# Patient Record
Sex: Male | Born: 1975
Health system: Southern US, Community
[De-identification: ages and names within clinical notes are randomized; demographics above are authoritative.]

## PROBLEM LIST (undated history)

## (undated) DIAGNOSIS — F32A Depression, unspecified: Secondary | ICD-10-CM

## (undated) DIAGNOSIS — I499 Cardiac arrhythmia, unspecified: Secondary | ICD-10-CM

## (undated) DIAGNOSIS — R7303 Prediabetes: Secondary | ICD-10-CM

## (undated) DIAGNOSIS — F329 Major depressive disorder, single episode, unspecified: Secondary | ICD-10-CM

## (undated) DIAGNOSIS — E119 Type 2 diabetes mellitus without complications: Secondary | ICD-10-CM

## (undated) DIAGNOSIS — K219 Gastro-esophageal reflux disease without esophagitis: Secondary | ICD-10-CM

## (undated) DIAGNOSIS — K449 Diaphragmatic hernia without obstruction or gangrene: Secondary | ICD-10-CM

## (undated) DIAGNOSIS — I209 Angina pectoris, unspecified: Secondary | ICD-10-CM

## (undated) HISTORY — DX: Depression, unspecified: F32.A

## (undated) HISTORY — DX: Gastro-esophageal reflux disease without esophagitis: K21.9

## (undated) HISTORY — DX: Type 2 diabetes mellitus without complications: E11.9

## (undated) HISTORY — DX: Diaphragmatic hernia without obstruction or gangrene: K44.9

## (undated) HISTORY — PX: UPPER GASTROINTESTINAL ENDOSCOPY: SHX188

## (undated) HISTORY — PX: FOOT SURGERY: SHX648

## (undated) HISTORY — PX: TOOTH EXTRACTION: SUR596

---
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
## 1999-05-26 ENCOUNTER — Emergency Department (HOSPITAL_COMMUNITY): Admission: EM | Admit: 1999-05-26 | Discharge: 1999-05-26 | Payer: Self-pay | Admitting: Emergency Medicine

## 2009-04-02 ENCOUNTER — Emergency Department (HOSPITAL_COMMUNITY): Admission: EM | Admit: 2009-04-02 | Discharge: 2009-04-03 | Payer: Self-pay | Admitting: Emergency Medicine

## 2011-03-18 LAB — CK: Total CK: 98 U/L (ref 7–232)

## 2011-03-18 LAB — COMPREHENSIVE METABOLIC PANEL
ALT: 22 U/L (ref 0–53)
AST: 23 U/L (ref 0–37)
Albumin: 3.9 g/dL (ref 3.5–5.2)
Alkaline Phosphatase: 51 U/L (ref 39–117)
BUN: 8 mg/dL (ref 6–23)
CO2: 25 mEq/L (ref 19–32)
Calcium: 9.2 mg/dL (ref 8.4–10.5)
Chloride: 104 mEq/L (ref 96–112)
Creatinine, Ser: 1.07 mg/dL (ref 0.4–1.5)
GFR calc Af Amer: >60 mL/min (ref >60)
GFR calc non Af Amer: >60 mL/min (ref >60)
Glucose, Bld: 96 mg/dL (ref 70–99)
Potassium: 3.8 mEq/L (ref 3.5–5.1)
Sodium: 134 mEq/L — ABNORMAL LOW (ref 135–145)
Total Bilirubin: 2.5 mg/dL — ABNORMAL HIGH (ref 0.3–1.2)
Total Protein: 7.4 g/dL (ref 6.0–8.3)

## 2011-03-18 LAB — URINALYSIS, ROUTINE W REFLEX MICROSCOPIC
Bilirubin Urine: NEGATIVE
Glucose, UA: NEGATIVE mg/dL
Hgb urine dipstick: NEGATIVE
Ketones, ur: NEGATIVE mg/dL
Nitrite: NEGATIVE
Protein, ur: NEGATIVE mg/dL
Specific Gravity, Urine: 1.014 (ref 1.005–1.030)
Urobilinogen, UA: 1 mg/dL (ref 0.0–1.0)
pH: 7.5 (ref 5.0–8.0)

## 2011-03-18 LAB — LIPASE, BLOOD: Lipase: 18 U/L (ref 11–59)

## 2011-03-18 LAB — DIFFERENTIAL
Basophils Absolute: 0.1 10*3/uL (ref 0.0–0.1)
Basophils Relative: 1 % (ref 0–1)
Eosinophils Absolute: 0 10*3/uL (ref 0.0–0.7)
Eosinophils Relative: 0 % (ref 0–5)
Lymphocytes Relative: 8 % — ABNORMAL LOW (ref 12–46)
Lymphs Abs: 1.2 10*3/uL (ref 0.7–4.0)
Monocytes Absolute: 1.6 10*3/uL — ABNORMAL HIGH (ref 0.1–1.0)
Monocytes Relative: 11 % (ref 3–12)
Neutro Abs: 12.1 10*3/uL — ABNORMAL HIGH (ref 1.7–7.7)
Neutrophils Relative %: 80 % — ABNORMAL HIGH (ref 43–77)

## 2011-03-18 LAB — CBC
HCT: 44 % (ref 39.0–52.0)
Hemoglobin: 14.9 g/dL (ref 13.0–17.0)
MCHC: 33.9 g/dL (ref 30.0–36.0)
MCV: 89.1 fL (ref 78.0–100.0)
Platelets: 190 10*3/uL (ref 150–400)
RBC: 4.93 MIL/uL (ref 4.22–5.81)
RDW: 13.1 % (ref 11.5–15.5)
WBC: 15.1 10*3/uL — ABNORMAL HIGH (ref 4.0–10.5)

## 2011-03-18 LAB — CULTURE, BLOOD (ROUTINE X 2)
Culture: NO GROWTH
Culture: NO GROWTH

## 2011-03-18 LAB — GC/CHLAMYDIA PROBE AMP, GENITAL
Chlamydia, DNA Probe: NEGATIVE
GC Probe Amp, Genital: NEGATIVE

## 2020-01-30 ENCOUNTER — Telehealth: Payer: Self-pay | Admitting: *Deleted

## 2020-01-30 NOTE — Telephone Encounter (Signed)
Tried to contact pt to go over screening questions prior to visit and phone rang and said call could not be completed at this time.Chad Morris, CMA

## 2020-01-30 NOTE — Progress Notes (Deleted)
    SUBJECTIVE:   CHIEF COMPLAINT / HPI:   ***  PERTINENT  PMH / PSH: ***  OBJECTIVE:   There were no vitals taken for this visit.  ***  ASSESSMENT/PLAN:   No problem-specific Assessment & Plan notes found for this encounter.     Shir Bergman, MD [facility]   

## 2020-01-31 ENCOUNTER — Ambulatory Visit: Payer: Self-pay | Admitting: Family Medicine

## 2020-01-31 ENCOUNTER — Ambulatory Visit: Payer: BC Managed Care – PPO | Admitting: Critical Care Medicine

## 2020-01-31 ENCOUNTER — Other Ambulatory Visit: Payer: Self-pay

## 2020-01-31 VITALS — BP 131/73 | HR 66 | Temp 98.0°F | Resp 18 | Ht 73.0 in

## 2020-01-31 DIAGNOSIS — Z114 Encounter for screening for human immunodeficiency virus [HIV]: Secondary | ICD-10-CM | POA: Diagnosis not present

## 2020-01-31 DIAGNOSIS — Z8639 Personal history of other endocrine, nutritional and metabolic disease: Secondary | ICD-10-CM

## 2020-01-31 DIAGNOSIS — R7303 Prediabetes: Secondary | ICD-10-CM | POA: Insufficient documentation

## 2020-01-31 DIAGNOSIS — Z23 Encounter for immunization: Secondary | ICD-10-CM | POA: Diagnosis not present

## 2020-01-31 LAB — POCT GLYCOSYLATED HEMOGLOBIN (HGB A1C): Hemoglobin A1C: 5.2 % (ref 4.0–5.6)

## 2020-01-31 LAB — GLUCOSE, POCT (MANUAL RESULT ENTRY): POC Glucose: 86 mg/dl (ref 70–99)

## 2020-01-31 NOTE — Assessment & Plan Note (Signed)
History of prediabetes however today the A1c is normal and glucose is 86  The patient currently is diet controlled and does not require further therapy  We will monitor this for now

## 2020-01-31 NOTE — Patient Instructions (Signed)
No medication changes at this visit  Your hemoglobin A1c is normal so you do not have active diabetes at this time just continue your exercise program healthy diet  Labs today include metabolic panel HIV and complete blood count  Return to see Dr. Delford Field in the community care clinic to establish in the next month

## 2020-01-31 NOTE — Progress Notes (Signed)
Subjective:    Patient ID: Chad Morris, male    DOB: 1976/09/15, 44 y.o.   MRN: [id number]  44 y.o. male who just got out of criminal justice system 2 months ago after being incarcerated for 9 years.  Patient is Currently under probationary arrangement locally.  He does have housing.  Patient was diagnosed with type 2 diabetes while in the prison and is coming to the mobile clinic today for further screening.  Note the patient does not have a primary care provider at this time.  Patient does not drink alcohol or use other recreational substances other than marijuana.  Patient has no specific complaints at this time.  He is not on any current medications.  Patient denies polyphagia polydipsia polyuria.  He is not monitoring his blood sugar at home.  Past Medical History:  Diagnosis Date  . Diabetes mellitus without complication (HCC)      Family History  Problem Relation Age of Onset  . Cancer Mother      Social History   Socioeconomic History  . Marital status: Single    Spouse name: Not on file  . Number of children: Not on file  . Years of education: Not on file  . Highest education level: Not on file  Occupational History  . Not on file  Tobacco Use  . Smoking status: Never Smoker  . Smokeless tobacco: Never Used  Substance and Sexual Activity  . Alcohol use: Not on file  . Drug use: Not on file  . Sexual activity: Not on file  Other Topics Concern  . Not on file  Social History Narrative  . Not on file   Social Determinants of Health   Financial Resource Strain:   . Difficulty of Paying Living Expenses: Not on file  Food Insecurity:   . Worried About Programme researcher, broadcasting/film/video in the Last Year: Not on file  . Ran Out of Food in the Last Year: Not on file  Transportation Needs:   . Lack of Transportation (Medical): Not on file  . Lack of Transportation (Non-Medical): Not on file  Physical Activity:   . Days of Exercise per Week: Not on file  . Minutes of  Exercise per Session: Not on file  Stress:   . Feeling of Stress : Not on file  Social Connections:   . Frequency of Communication with Friends and Family: Not on file  . Frequency of Social Gatherings with Friends and Family: Not on file  . Attends Religious Services: Not on file  . Active Member of Clubs or Organizations: Not on file  . Attends Banker Meetings: Not on file  . Marital Status: Not on file  Intimate Partner Violence:   . Fear of Current or Ex-Partner: Not on file  . Emotionally Abused: Not on file  . Physically Abused: Not on file  . Sexually Abused: Not on file     Allergies  Allergen Reactions  . Penicillins     Diagnosed as a child, does not recall Sx     No outpatient medications prior to visit.   No facility-administered medications prior to visit.    Review of Systems  HENT: Positive for postnasal drip and rhinorrhea. Negative for sore throat and trouble swallowing.   Eyes: Negative.   Respiratory: Positive for cough. Negative for shortness of breath and wheezing.   Cardiovascular: Positive for palpitations. Negative for chest pain and leg swelling.       Occ palpitations  Gastrointestinal: Negative for rectal pain.       GERD  Genitourinary: Negative.   Musculoskeletal: Negative.   Neurological: Negative for seizures and syncope.  Psychiatric/Behavioral: Negative.  Negative for self-injury, sleep disturbance and suicidal ideas.       Objective:   Physical Exam Vitals:   01/31/20 1053  BP: 131/73  Pulse: 66  Resp: 18  Temp: 98 F (36.7 C)  TempSrc: Oral  SpO2: 96%  Height: 6\' 1"  (1.854 m)    Gen: Pleasant, well-nourished, in no distress,  normal affect well-developed and muscular  ENT: No lesions,  mouth clear,  oropharynx clear, no postnasal drip  Neck: No JVD, no TMG, no carotid bruits  Lungs: No use of accessory muscles, no dullness to percussion, clear without rales or rhonchi  Cardiovascular: RRR, heart sounds  normal, no murmur or gallops, no peripheral edema  Abdomen: soft and NT, no HSM,  BS normal  Musculoskeletal: No deformities, no cyanosis or clubbing  Neuro: alert, non focal  Skin: Warm, no lesions or rashes   No recent labs in the system  Note hemoglobin A1c was 5.2 today Lab Results  Component Value Date   HGBA1C 5.2 01/31/2020    Capillary blood glucose today 86     Assessment & Plan:  I personally reviewed all images and lab data in the [facility] system as well as any outside material available during this office visit and agree with the  radiology impressions.   Prediabetes History of prediabetes however today the A1c is normal and glucose is 86  The patient currently is diet controlled and does not require further therapy  We will monitor this for now   Chad Morris was seen today for diabetes.  Diagnoses and all orders for this visit:  History of elevated glucose -     HgB A1c -     Glucose (CBG) -     Comprehensive metabolic panel -     CBC with Differential/Platelet; Future -     CBC with Differential/Platelet  Encounter for screening for HIV -     HIV antibody (with reflex)  Prediabetes  Other orders -     Tdap vaccine greater than or equal to 7yo IM   Will obtain a general lab screen today and an HIV panel and have the patient come to the community care clinic to establish for primary care in the next month

## 2020-02-01 LAB — CBC WITH DIFFERENTIAL/PLATELET
Basophils Absolute: 0.1 10*3/uL (ref 0.0–0.2)
Basos: 1 %
EOS (ABSOLUTE): 0.1 10*3/uL (ref 0.0–0.4)
Eos: 3 %
Hematocrit: 44.3 % (ref 37.5–51.0)
Hemoglobin: 14.9 g/dL (ref 13.0–17.7)
Immature Grans (Abs): 0 10*3/uL (ref 0.0–0.1)
Immature Granulocytes: 0 %
Lymphocytes Absolute: 2.1 10*3/uL (ref 0.7–3.1)
Lymphs: 44 %
MCH: 29.4 pg (ref 26.6–33.0)
MCHC: 33.6 g/dL (ref 31.5–35.7)
MCV: 87 fL (ref 79–97)
Monocytes Absolute: 0.5 10*3/uL (ref 0.1–0.9)
Monocytes: 10 %
Neutrophils Absolute: 2 10*3/uL (ref 1.4–7.0)
Neutrophils: 42 %
Platelets: 213 10*3/uL (ref 150–450)
RBC: 5.07 x10E6/uL (ref 4.14–5.80)
RDW: 13.3 % (ref 11.6–15.4)
WBC: 4.7 10*3/uL (ref 3.4–10.8)

## 2020-02-01 LAB — COMPREHENSIVE METABOLIC PANEL
ALT: 23 IU/L (ref 0–44)
AST: 33 IU/L (ref 0–40)
Albumin/Globulin Ratio: 1.2 (ref 1.2–2.2)
Albumin: 4.1 g/dL (ref 4.0–5.0)
Alkaline Phosphatase: 88 IU/L (ref 39–117)
BUN/Creatinine Ratio: 9 (ref 9–20)
BUN: 10 mg/dL (ref 6–24)
Bilirubin Total: 0.8 mg/dL (ref 0.0–1.2)
CO2: 20 mmol/L (ref 20–29)
Calcium: 9.3 mg/dL (ref 8.7–10.2)
Chloride: 108 mmol/L — ABNORMAL HIGH (ref 96–106)
Creatinine, Ser: 1.1 mg/dL (ref 0.76–1.27)
GFR calc Af Amer: 96 mL/min/{1.73_m2} (ref >59)
GFR calc non Af Amer: 83 mL/min/{1.73_m2} (ref >59)
Globulin, Total: 3.5 g/dL (ref 1.5–4.5)
Glucose: 81 mg/dL (ref 65–99)
Potassium: 4.3 mmol/L (ref 3.5–5.2)
Sodium: 141 mmol/L (ref 134–144)
Total Protein: 7.6 g/dL (ref 6.0–8.5)

## 2020-02-01 LAB — HIV ANTIBODY (ROUTINE TESTING W REFLEX): HIV Screen 4th Generation wRfx: NONREACTIVE

## 2020-02-02 ENCOUNTER — Telehealth: Payer: Self-pay | Admitting: *Deleted

## 2020-02-02 NOTE — Telephone Encounter (Signed)
-----   Message from Storm Frisk, MD sent at 02/02/2020  9:15 AM EST ----- Please call Chad Morris and let him know his CMET is normal   liver kidney normal, blood counts normal and HIV neg

## 2020-02-02 NOTE — Telephone Encounter (Signed)
MA unable to reach patient x2 and unable to leave a VM due to no option being available. !ALL LABS ARE NORMAL!

## 2020-02-13 ENCOUNTER — Encounter: Payer: Self-pay | Admitting: *Deleted

## 2020-02-13 NOTE — Progress Notes (Signed)
MA unable to reach patient. Communication letter has been mailed out to patient regarding normal results.

## 2020-02-13 NOTE — Progress Notes (Signed)
Communication letter has been mailed out due to multiple attempts to reach the patient.

## 2020-02-29 ENCOUNTER — Ambulatory Visit: Payer: Self-pay | Admitting: Critical Care Medicine

## 2020-03-17 ENCOUNTER — Emergency Department (HOSPITAL_COMMUNITY): Payer: BC Managed Care – PPO

## 2020-03-17 ENCOUNTER — Encounter (HOSPITAL_COMMUNITY): Payer: Self-pay

## 2020-03-17 ENCOUNTER — Other Ambulatory Visit: Payer: Self-pay

## 2020-03-17 ENCOUNTER — Emergency Department (HOSPITAL_COMMUNITY)
Admission: EM | Admit: 2020-03-17 | Discharge: 2020-03-17 | Disposition: A | Discharge status: Home / Self Care | Payer: BC Managed Care – PPO | Attending: Emergency Medicine | Admitting: Emergency Medicine

## 2020-03-17 DIAGNOSIS — Y998 Other external cause status: Secondary | ICD-10-CM | POA: Diagnosis not present

## 2020-03-17 DIAGNOSIS — F1210 Cannabis abuse, uncomplicated: Secondary | ICD-10-CM | POA: Diagnosis not present

## 2020-03-17 DIAGNOSIS — F17200 Nicotine dependence, unspecified, uncomplicated: Secondary | ICD-10-CM | POA: Insufficient documentation

## 2020-03-17 DIAGNOSIS — S31030A Puncture wound without foreign body of lower back and pelvis without penetration into retroperitoneum, initial encounter: Secondary | ICD-10-CM | POA: Diagnosis not present

## 2020-03-17 DIAGNOSIS — S21212A Laceration without foreign body of left back wall of thorax without penetration into thoracic cavity, initial encounter: Secondary | ICD-10-CM | POA: Insufficient documentation

## 2020-03-17 DIAGNOSIS — E119 Type 2 diabetes mellitus without complications: Secondary | ICD-10-CM | POA: Diagnosis not present

## 2020-03-17 DIAGNOSIS — R9431 Abnormal electrocardiogram [ECG] [EKG]: Secondary | ICD-10-CM | POA: Diagnosis not present

## 2020-03-17 DIAGNOSIS — Y9389 Activity, other specified: Secondary | ICD-10-CM | POA: Insufficient documentation

## 2020-03-17 DIAGNOSIS — S20402A Unspecified superficial injuries of left back wall of thorax, initial encounter: Secondary | ICD-10-CM | POA: Diagnosis not present

## 2020-03-17 DIAGNOSIS — Y929 Unspecified place or not applicable: Secondary | ICD-10-CM | POA: Insufficient documentation

## 2020-03-17 DIAGNOSIS — S29092A Other injury of muscle and tendon of back wall of thorax, initial encounter: Secondary | ICD-10-CM | POA: Diagnosis not present

## 2020-03-17 LAB — COMPREHENSIVE METABOLIC PANEL
ALT: 20 U/L (ref 0–44)
AST: 24 U/L (ref 15–41)
Albumin: 4.3 g/dL (ref 3.5–5.0)
Alkaline Phosphatase: 67 U/L (ref 38–126)
Anion gap: 11 (ref 5–15)
BUN: 9 mg/dL (ref 6–20)
CO2: 23 mmol/L (ref 22–32)
Calcium: 9.3 mg/dL (ref 8.9–10.3)
Chloride: 101 mmol/L (ref 98–111)
Creatinine, Ser: 1.29 mg/dL — ABNORMAL HIGH (ref 0.61–1.24)
GFR calc Af Amer: >60 mL/min (ref >60)
GFR calc non Af Amer: >60 mL/min (ref >60)
Glucose, Bld: 94 mg/dL (ref 70–99)
Potassium: 3.3 mmol/L — ABNORMAL LOW (ref 3.5–5.1)
Sodium: 135 mmol/L (ref 135–145)
Total Bilirubin: 1.6 mg/dL — ABNORMAL HIGH (ref 0.3–1.2)
Total Protein: 8.2 g/dL — ABNORMAL HIGH (ref 6.5–8.1)

## 2020-03-17 LAB — URINALYSIS, ROUTINE W REFLEX MICROSCOPIC
Bacteria, UA: NONE SEEN
Bilirubin Urine: NEGATIVE
Glucose, UA: NEGATIVE mg/dL
Hgb urine dipstick: NEGATIVE
Ketones, ur: NEGATIVE mg/dL
Leukocytes,Ua: NEGATIVE
Nitrite: NEGATIVE
Protein, ur: 30 mg/dL — AB
Specific Gravity, Urine: >1.046 — ABNORMAL HIGH (ref 1.005–1.030)
pH: 7 (ref 5.0–8.0)

## 2020-03-17 LAB — I-STAT CHEM 8, ED
BUN: 10 mg/dL (ref 6–20)
Calcium, Ion: 1.17 mmol/L (ref 1.15–1.40)
Chloride: 103 mmol/L (ref 98–111)
Creatinine, Ser: 1.2 mg/dL (ref 0.61–1.24)
Glucose, Bld: 86 mg/dL (ref 70–99)
HCT: 46 % (ref 39.0–52.0)
Hemoglobin: 15.6 g/dL (ref 13.0–17.0)
Potassium: 3.3 mmol/L — ABNORMAL LOW (ref 3.5–5.1)
Sodium: 142 mmol/L (ref 135–145)
TCO2: 24 mmol/L (ref 22–32)

## 2020-03-17 LAB — LACTIC ACID, PLASMA: Lactic Acid, Venous: 2.4 mmol/L (ref 0.5–1.9)

## 2020-03-17 LAB — CBC
HCT: 44.7 % (ref 39.0–52.0)
Hemoglobin: 14.4 g/dL (ref 13.0–17.0)
MCH: 29.2 pg (ref 26.0–34.0)
MCHC: 32.2 g/dL (ref 30.0–36.0)
MCV: 90.7 fL (ref 80.0–100.0)
Platelets: 233 10*3/uL (ref 150–400)
RBC: 4.93 MIL/uL (ref 4.22–5.81)
RDW: 13.2 % (ref 11.5–15.5)
WBC: 6.6 10*3/uL (ref 4.0–10.5)
nRBC: 0 % (ref 0.0–0.2)

## 2020-03-17 LAB — PROTIME-INR
INR: 1 (ref 0.8–1.2)
Prothrombin Time: 13 seconds (ref 11.4–15.2)

## 2020-03-17 LAB — SAMPLE TO BLOOD BANK

## 2020-03-17 LAB — ETHANOL: Alcohol, Ethyl (B): <10 mg/dL (ref <10)

## 2020-03-17 MED ORDER — CEPHALEXIN 250 MG PO CAPS
1000.0000 mg | ORAL_CAPSULE | Freq: Once | ORAL | Status: AC
  Administered 2020-03-17: 1000 mg via ORAL
  Filled 2020-03-17: qty 4

## 2020-03-17 MED ORDER — IOHEXOL 300 MG/ML  SOLN
100.0000 mL | Freq: Once | INTRAMUSCULAR | Status: AC | PRN
  Administered 2020-03-17: 100 mL via INTRAVENOUS

## 2020-03-17 MED ORDER — ACETAMINOPHEN 500 MG PO TABS
1000.0000 mg | ORAL_TABLET | Freq: Once | ORAL | Status: AC
  Administered 2020-03-17: 1000 mg via ORAL
  Filled 2020-03-17: qty 2

## 2020-03-17 MED ORDER — TETANUS-DIPHTH-ACELL PERTUSSIS 5-2.5-18.5 LF-MCG/0.5 IM SUSP
0.5000 mL | Freq: Once | INTRAMUSCULAR | Status: DC
  Filled 2020-03-17: qty 0.5

## 2020-03-17 MED ORDER — LIDOCAINE-EPINEPHRINE (PF) 2 %-1:200000 IJ SOLN
20.0000 mL | Freq: Once | INTRAMUSCULAR | Status: AC
  Administered 2020-03-17: 20 mL
  Filled 2020-03-17: qty 20

## 2020-03-17 MED ORDER — POTASSIUM CHLORIDE CRYS ER 20 MEQ PO TBCR
40.0000 meq | EXTENDED_RELEASE_TABLET | Freq: Once | ORAL | Status: AC
  Administered 2020-03-17: 40 meq via ORAL
  Filled 2020-03-17: qty 2

## 2020-03-17 MED ORDER — CEPHALEXIN 500 MG PO CAPS: 500.0000 mg | ORAL_CAPSULE | Freq: Four times a day (QID) | ORAL | 0 refills | Status: DC
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
## 2020-03-17 NOTE — Discharge Instructions (Addendum)
It was our pleasure to provide your ER care today - we hope that you feel better.  Keep wound very clean.   Take antibiotic as prescribed.   Take acetaminophen or ibuprofen as need for pain.   From today's lab tests, your potassium level is slightly low (3.3) - eat plenty of fruits and vegetables, and follow up with primary care doctor.   Return to ER if worse, new symptoms, fevers, infection of wound (pus, spreading redness, etc), severe pain, or other concern.
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
## 2020-03-17 NOTE — ED Notes (Signed)
Suture cart is outside pts room.

## 2020-03-17 NOTE — ED Notes (Signed)
Pt. Wife Janelle at the bedside.

## 2020-03-17 NOTE — ED Provider Notes (Signed)
..  Laceration Repair  Date/Time: 03/17/2020 10:20 PM Performed by: Maxwell Caul, PA-C Authorized by: Maxwell Caul, PA-C   Consent:    Consent obtained:  Verbal   Consent given by:  Patient   Risks discussed:  Infection, need for additional repair, pain, poor cosmetic result and poor wound healing   Alternatives discussed:  No treatment and delayed treatment Universal protocol:    Procedure explained and questions answered to patient or proxy's satisfaction: yes     Relevant documents present and verified: yes     Test results available and properly labeled: yes     Imaging studies available: yes     Required blood products, implants, devices, and special equipment available: yes     Site/side marked: yes     Immediately prior to procedure, a time out was called: yes     Patient identity confirmed:  Verbally with patient Anesthesia (see MAR for exact dosages):    Anesthesia method:  Local infiltration   Local anesthetic:  Lidocaine 2% WITH epi Laceration details:    Location:  Trunk   Trunk location:  Lower back   Length (cm):  2 Repair type:    Repair type:  Intermediate Pre-procedure details:    Preparation:  Imaging obtained to evaluate for foreign bodies Exploration:    Hemostasis achieved with:  Direct pressure   Wound exploration: wound explored through full range of motion     Wound extent: no foreign bodies/material noted   Treatment:    Area cleansed with:  Betadine   Amount of cleaning:  Extensive   Irrigation solution:  Sterile saline   Irrigation method:  Syringe   Visualized foreign bodies/material removed: no   Skin repair:    Repair method:  Sutures   Suture size:  4-0   Suture material:  Chromic gut   Suture technique:  Simple interrupted   Number of sutures:  5 Approximation:    Approximation:  Close Post-procedure details:    Dressing:  Antibiotic ointment   Patient tolerance of procedure:  Tolerated well, no immediate complications       Rosana Hoes 03/17/20 2221    Cathren Laine, MD 03/17/20 2317

## 2020-03-17 NOTE — ED Notes (Signed)
Pt. Declined Tdap. Pt. Stated, he recently had vaccine.
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
## 2020-05-05 DIAGNOSIS — H1032 Unspecified acute conjunctivitis, left eye: Secondary | ICD-10-CM | POA: Diagnosis not present

## 2020-05-27 ENCOUNTER — Ambulatory Visit (INDEPENDENT_AMBULATORY_CARE_PROVIDER_SITE_OTHER): Payer: BC Managed Care – PPO | Admitting: Physician Assistant

## 2020-05-27 ENCOUNTER — Encounter: Payer: Self-pay | Admitting: Physician Assistant

## 2020-05-27 ENCOUNTER — Other Ambulatory Visit: Payer: Self-pay

## 2020-05-27 VITALS — BP 113/65 | HR 55 | Temp 98.0°F | Ht 73.0 in | Wt 246.0 lb

## 2020-05-27 DIAGNOSIS — Z8639 Personal history of other endocrine, nutritional and metabolic disease: Secondary | ICD-10-CM

## 2020-05-27 DIAGNOSIS — K4090 Unilateral inguinal hernia, without obstruction or gangrene, not specified as recurrent: Secondary | ICD-10-CM

## 2020-05-27 DIAGNOSIS — K2100 Gastro-esophageal reflux disease with esophagitis, without bleeding: Secondary | ICD-10-CM

## 2020-05-27 DIAGNOSIS — K920 Hematemesis: Secondary | ICD-10-CM | POA: Diagnosis not present

## 2020-05-27 DIAGNOSIS — R5382 Chronic fatigue, unspecified: Secondary | ICD-10-CM

## 2020-05-27 DIAGNOSIS — L280 Lichen simplex chronicus: Secondary | ICD-10-CM

## 2020-05-27 LAB — CBC WITH DIFFERENTIAL/PLATELET
Basophils Absolute: 0 10*3/uL (ref 0.0–0.1)
Basophils Relative: 1 % (ref 0.0–3.0)
Eosinophils Absolute: 0.2 10*3/uL (ref 0.0–0.7)
Eosinophils Relative: 4.4 % (ref 0.0–5.0)
HCT: 41.3 % (ref 39.0–52.0)
Hemoglobin: 13.7 g/dL (ref 13.0–17.0)
Lymphocytes Relative: 37.5 % (ref 12.0–46.0)
Lymphs Abs: 1.7 10*3/uL (ref 0.7–4.0)
MCHC: 33.1 g/dL (ref 30.0–36.0)
MCV: 90.2 fl (ref 78.0–100.0)
Monocytes Absolute: 0.4 10*3/uL (ref 0.1–1.0)
Monocytes Relative: 8.4 % (ref 3.0–12.0)
Neutro Abs: 2.2 10*3/uL (ref 1.4–7.7)
Neutrophils Relative %: 48.7 % (ref 43.0–77.0)
Platelets: 213 10*3/uL (ref 150.0–400.0)
RBC: 4.58 Mil/uL (ref 4.22–5.81)
RDW: 13.6 % (ref 11.5–15.5)
WBC: 4.5 10*3/uL (ref 4.0–10.5)

## 2020-05-27 LAB — COMPREHENSIVE METABOLIC PANEL
ALT: 19 U/L (ref 0–53)
AST: 17 U/L (ref 0–37)
Albumin: 4.2 g/dL (ref 3.5–5.2)
Alkaline Phosphatase: 60 U/L (ref 39–117)
BUN: 12 mg/dL (ref 6–23)
CO2: 29 mEq/L (ref 19–32)
Calcium: 9.4 mg/dL (ref 8.4–10.5)
Chloride: 106 mEq/L (ref 96–112)
Creatinine, Ser: 1.11 mg/dL (ref 0.40–1.50)
GFR: 88.11 mL/min (ref >60.00)
Glucose, Bld: 79 mg/dL (ref 70–99)
Potassium: 4.3 mEq/L (ref 3.5–5.1)
Sodium: 139 mEq/L (ref 135–145)
Total Bilirubin: 0.8 mg/dL (ref 0.2–1.2)
Total Protein: 7 g/dL (ref 6.0–8.3)

## 2020-05-27 LAB — HEMOGLOBIN A1C: Hgb A1c MFr Bld: 5.4 % (ref 4.6–6.5)

## 2020-05-27 LAB — TSH: TSH: 1.45 u[IU]/mL (ref 0.35–4.50)

## 2020-05-27 LAB — H. PYLORI ANTIBODY, IGG: H Pylori IgG: NEGATIVE

## 2020-05-27 MED ORDER — PANTOPRAZOLE SODIUM 40 MG PO TBEC: 40.0000 mg | DELAYED_RELEASE_TABLET | Freq: Every day | ORAL | 3 refills | Status: DC

## 2020-05-27 MED ORDER — CLOTRIMAZOLE-BETAMETHASONE 1-0.05 % EX CREA: 1.0000 "application " | TOPICAL_CREAM | Freq: Two times a day (BID) | CUTANEOUS | 0 refills | Status: DC

## 2020-05-27 NOTE — Progress Notes (Signed)
Patient presents to clinic today to establish care.  Patient with multiple concerns today.  Of note patient endorses prior diagnosis of diabetes, previously on Metformin.  Has not taken in some time.  States that his levels have looked good so he was allowed to stop medicine.  Notes keeping well hydrated and trying to follow a low-carb diet overall.  Denies any known history of diabetic retinopathy or nephropathy.  Denies symptoms of neuropathy.  Patient endorses few months of postprandial nausea and vomiting, occasionally with blood-tinged emesis.  Will note occasional upper abdominal pain but this comes and goes.  Patient does note significant prior history of GERD for which she takes OTC omeprazole.  States he feels like heartburn has been controlled overall with this regimen.  Denies any known history of gastric ulcer or peptic ulcer disease.  Denies fever, chills.  Some anorexia mainly due to worrying about vomiting.  As such he eats very small amounts of food at each meal.  Patient denies any constipation, diarrhea, melena, hematochezia or tenesmus.  Patient does smoke marijuana daily.  Is also a tobacco smoker, smoking a few cigarettes per day.  Denies any chest pain, chronic cough or shortness of breath.  Denies globus sensation, dysphagia or odynophagia.  Patient also endorses a rash of groin bilaterally that has been present for quite some time.  Notes the rash is quite itchy.  Denies any drainage from the area.  Rash does not cover scrotum or penis.  Patient also notes a knot in his right inguinal region that has been present for couple months.  Denies any pain with this.  Denies any trauma or injury recently.  Denies history of hernia.  Denies noting a similar area elsewhere.  Patient also notes several months of fatigue.  Notes he just feels tired each morning despite adequate rest at night.  Denies any chest pain or shortness of breath with exertion.  Does note some depressed mood and  anhedonia but feels this is mainly related to his fatigue and GI issues going on and worry about what could be causing it.  Denies suicidal thought or ideation.  Denies excessive snoring, witnessed apneic episode, PND orthopnea.  Denies peripheral edema.  Past Medical History:  Diagnosis Date  . History of prediabetes     Past Surgical History:  Procedure Laterality Date  . FOOT SURGERY     right 5th digit    Current Outpatient Medications on File Prior to Visit  Medication Sig Dispense Refill  . omeprazole (PRILOSEC) 20 MG capsule Take 20 mg by mouth daily.     No current facility-administered medications on file prior to visit.    Allergies  Allergen Reactions  . Penicillins     Diagnosed as a child, does not recall Sx    Family History  Problem Relation Age of Onset  . Cancer Mother   . Breast cancer Sister        66    Social History   Socioeconomic History  . Marital status: Single    Spouse name: Not on file  . Number of children: Not on file  . Years of education: Not on file  . Highest education level: Not on file  Occupational History  . Not on file  Tobacco Use  . Smoking status: Current Every Day Smoker  . Smokeless tobacco: Never Used  Vaping Use  . Vaping Use: Never used  Substance and Sexual Activity  . Alcohol use: Yes  . Drug use:  Yes    Types: Marijuana  . Sexual activity: Yes  Other Topics Concern  . Not on file  Social History Narrative   ** Merged History Encounter **       Social Determinants of Health   Financial Resource Strain:   . Difficulty of Paying Living Expenses:   Food Insecurity:   . Worried About Charity fundraiser in the Last Year:   . Arboriculturist in the Last Year:   Transportation Needs:   . Film/video editor (Medical):   Marland Kitchen Lack of Transportation (Non-Medical):   Physical Activity:   . Days of Exercise per Week:   . Minutes of Exercise per Session:   Stress:   . Feeling of Stress :   Social  Connections:   . Frequency of Communication with Friends and Family:   . Frequency of Social Gatherings with Friends and Family:   . Attends Religious Services:   . Active Member of Clubs or Organizations:   . Attends Archivist Meetings:   Marland Kitchen Marital Status:   Intimate Partner Violence:   . Fear of Current or Ex-Partner:   . Emotionally Abused:   Marland Kitchen Physically Abused:   . Sexually Abused:    ROS Pertinent ROS are listed in the HPI.  BP 113/65   Pulse (!) 55   Temp 98 F (36.7 C)   Ht 6' 1"  (1.854 m)   Wt 246 lb (111.6 kg)   SpO2 96%   BMI 32.46 kg/m   Physical Exam  Recent Results (from the past 2160 hour(s))  Sample to Blood Bank     Status: None   Collection Time: 03/17/20  7:09 PM  Result Value Ref Range   Blood Bank Specimen SAMPLE AVAILABLE FOR TESTING    Sample Expiration      03/18/2020,2359 Performed at [facility], [place] [address] [place], [place] [address]   Ethanol     Status: None   Collection Time: 03/17/20  7:10 PM  Result Value Ref Range   Alcohol, Ethyl (B) <10 <10 mg/dL    Comment: (NOTE) Lowest detectable limit for serum alcohol is 10 mg/dL. For medical purposes only. Performed at [facility], [place] [address] [place], [place] [address]   Lactic acid, plasma     Status: Abnormal   Collection Time: 03/17/20  7:10 PM  Result Value Ref Range   Lactic Acid, Venous 2.4 (HH) 0.5 - 1.9 mmol/L    Comment: CRITICAL RESULT CALLED TO, READ BACK BY AND VERIFIED WITH: BENSON [facility] 03/17/20 2110 WAYK Performed at [facility], [place] [address], [place], [place] [address]   I-Stat Chem 8, ED     Status: Abnormal   Collection Time: 03/17/20  7:22 PM  Result Value Ref Range   Sodium 142 135 - 145 mmol/L   Potassium 3.3 (L) 3.5 - 5.1 mmol/L   Chloride 103 98 - 111 mmol/L   BUN 10 6 - 20 mg/dL   Creatinine, Ser 1.20 0.61 - 1.24 mg/dL   Glucose, Bld 86 70 - 99 mg/dL    Comment: Glucose reference range applies only to samples taken  after fasting for at least 8 hours.   Calcium, Ion 1.17 1.15 - 1.40 mmol/L   TCO2 24 22 - 32 mmol/L   Hemoglobin 15.6 13.0 - 17.0 g/dL   HCT 46.0 39 - 52 %  Comprehensive metabolic panel     Status: Abnormal   Collection  Time: 03/17/20  7:26 PM  Result Value Ref Range   Sodium 135 135 - 145 mmol/L    Comment: DELTA CHECK NOTED   Potassium 3.3 (L) 3.5 - 5.1 mmol/L   Chloride 101 98 - 111 mmol/L   CO2 23 22 - 32 mmol/L   Glucose, Bld 94 70 - 99 mg/dL    Comment: Glucose reference range applies only to samples taken after fasting for at least 8 hours.   BUN 9 6 - 20 mg/dL   Creatinine, Ser 1.29 (H) 0.61 - 1.24 mg/dL   Calcium 9.3 8.9 - 10.3 mg/dL   Total Protein 8.2 (H) 6.5 - 8.1 g/dL   Albumin 4.3 3.5 - 5.0 g/dL   AST 24 15 - 41 U/L   ALT 20 0 - 44 U/L   Alkaline Phosphatase 67 38 - 126 U/L   Total Bilirubin 1.6 (H) 0.3 - 1.2 mg/dL   GFR calc non Af Amer >60 >60 mL/min   GFR calc Af Amer >60 >60 mL/min   Anion gap 11 5 - 15    Comment: Performed at [place] [address], [place] [address]  CBC     Status: None   Collection Time: 03/17/20  7:26 PM  Result Value Ref Range   WBC 6.6 4.0 - 10.5 K/uL   RBC 4.93 4.22 - 5.81 MIL/uL   Hemoglobin 14.4 13.0 - 17.0 g/dL   HCT 44.7 39 - 52 %   MCV 90.7 80.0 - 100.0 fL   MCH 29.2 26.0 - 34.0 pg   MCHC 32.2 30.0 - 36.0 g/dL   RDW 13.2 11.5 - 15.5 %   Platelets 233 150 - 400 K/uL   nRBC 0.0 0.0 - 0.2 %    Comment: Performed at [facility], [place] [address], [place], [place] [address]  Protime-INR     Status: None   Collection Time: 03/17/20  7:26 PM  Result Value Ref Range   Prothrombin Time 13.0 11.4 - 15.2 seconds   INR 1.0 0.8 - 1.2    Comment: (NOTE) INR goal varies based on device and disease states. Performed at [facility], [place] [address] [place], [place] [address]   Urinalysis, Routine w reflex microscopic     Status: Abnormal   Collection Time: 03/17/20  9:17 PM  Result Value Ref Range     Color, Urine YELLOW YELLOW   APPearance CLEAR CLEAR   Specific Gravity, Urine >1.046 (H) 1.005 - 1.030   pH 7.0 5.0 - 8.0   Glucose, UA NEGATIVE NEGATIVE mg/dL   Hgb urine dipstick NEGATIVE NEGATIVE   Bilirubin Urine NEGATIVE NEGATIVE   Ketones, ur NEGATIVE NEGATIVE mg/dL   Protein, ur 30 (A) NEGATIVE mg/dL   Nitrite NEGATIVE NEGATIVE   Leukocytes,Ua NEGATIVE NEGATIVE   RBC / HPF 0-5 0 - 5 RBC/hpf   WBC, UA 0-5 0 - 5 WBC/hpf   Bacteria, UA NONE SEEN NONE SEEN   Squamous Epithelial / LPF 0-5 0 - 5   Mucus PRESENT     Comment: Performed at [facility], [place] [address] [place], [place] [address]    Assessment/Plan: 1. Chronic fatigue Unclear etiology.  PHQ screen positive.  Seems mostly situational.  Will check labs today to include CBC, CMP and TSH.  Also continue working up his GI symptoms as well.  Hopefully once we get things under control energy levels will improve.  Discussed that if labs are normal  we should consider potentially starting an antidepressant medication or begin counseling to help with mood while other things are being worked up.  Discussed that fatigue could be solely stemming from the depressive state.  He will give some thought to this. - CBC with Differential/Platelet - Comprehensive metabolic panel - TSH  2. Hematemesis, presence of nausea not specified 3. Gastroesophageal reflux disease with esophagitis, unspecified whether hemorrhage Needs further assessment.  Examination unremarkable for any tenderness today.  Giving history of GERD concern for gastritis/esophagitis we will have him stop OTC omeprazole.  Will start Protonix 40 mg once daily.  Dietary recommendations reviewed.  Will obtain labs today to include CBC, c-Met and H. pylori IgG.  Referral to gastroenterology for further evaluation and assessment for EGD placed.  Strict ER precautions reviewed with patient. - CBC with Differential/Platelet - Comprehensive metabolic panel - H. pylori  antibody, IgG - Ambulatory referral to Gastroenterology  4. Direct inguinal hernia of right side Reducible.  Nonpainful.  Discussed general surgery consult versus observation.  Patient would like to proceed with general surgery referral.  Referral placed. - Ambulatory referral to General Surgery  5. Lichen simplex chronicus Supportive measures and OTC medications reviewed.  We will start him on a short course of Lotrisone to help with the lichen simplex chronicus and to eliminate any fungal infection it may also be present in the area.  Follow-up in 2 weeks for reassessment.  Discussed with patient this could be a chronic and relapsing condition.  6. History of diabetes mellitus, type II We will obtain CMP and A1c to assess current glycemic status.  We will treat appropriately once results are in. - Comprehensive metabolic panel - Hemoglobin A1c  This visit occurred during the SARS-CoV-2 public health emergency.  Safety protocols were in place, including screening questions prior to the visit, additional usage of staff PPE, and extensive cleaning of exam room while observing appropriate contact time as indicated for disinfecting solutions.     Leeanne Rio, PA-C

## 2020-05-27 NOTE — Patient Instructions (Signed)
Please go to the lab today for blood work.  I will call you with your results. We will alter treatment regimen(s) if indicated by your results.   Please stop the OTC Prilosec.  Start the Protonix as directed along with the dietary recommendations below.  I am setting you up with a Gastroenterologist for further evaluation of ongoing issue.   Avoid heavy lifting. I am setting you up with General Surgery for the inguinal hernia noted on examination. If you note any severe pain in the area or inability to have a bowel movement, please be seen at the nearest ER.   For the area in the groin, keep skin clean and dry. If going for a ride on your motorcycle, or anything else that makes you sweaty, wash and change clothes as soon as you are home.  Make sure the area is dry well after shower -- can use a hair dryer If needed. Apply the prescription cream twice daily for the two weeks.  Follow-up with me after that for reassessment.   For the wart of the foot, get some compound W OTC or soak a cottonball in apple cider vinegar and apply to the area nightly, covering with a bandaid. Remove in the morning and wash well.  If not improving we can do treatments in office or have you set up with Podiatry.   I am reassessing your sugar levels in addition to other lab work to find cause of this fatigue. We will work on getting this figured out and getting you feeling better!  It was very nice meeting you today. Welcome to [employer]!   Food Choices for Gastroesophageal Reflux Disease, Adult When you have gastroesophageal reflux disease (GERD), the foods you eat and your eating habits are very important. Choosing the right foods can help ease your discomfort. Think about working with a nutrition specialist (dietitian) to help you make good choices. What are tips for following this plan?  Meals  Choose healthy foods that are low in fat, such as fruits, vegetables, whole grains, low-fat dairy products, and  lean meat, fish, and poultry.  Eat small meals often instead of 3 large meals a day. Eat your meals slowly, and in a place where you are relaxed. Avoid bending over or lying down until 2-3 hours after eating.  Avoid eating meals 2-3 hours before bed.  Avoid drinking a lot of liquid with meals.  Cook foods using methods other than frying. Bake, grill, or broil food instead.  Avoid or limit: ? Chocolate. ? Peppermint or spearmint. ? Alcohol. ? Pepper. ? Black and decaffeinated coffee. ? Black and decaffeinated tea. ? Bubbly (carbonated) soft drinks. ? Caffeinated energy drinks and soft drinks.  Limit high-fat foods such as: ? Fatty meat or fried foods. ? Whole milk, cream, butter, or ice cream. ? Nuts and nut butters. ? Pastries, donuts, and sweets made with butter or shortening.  Avoid foods that cause symptoms. These foods may be different for everyone. Common foods that cause symptoms include: ? Tomatoes. ? Oranges, lemons, and limes. ? Peppers. ? Spicy food. ? Onions and garlic. ? Vinegar. Lifestyle  Maintain a healthy weight. Ask your doctor what weight is healthy for you. If you need to lose weight, work with your doctor to do so safely.  Exercise for at least 30 minutes for 5 or more days each week, or as told by your doctor.  Wear loose-fitting clothes.  Do not smoke. If you need help quitting, ask your doctor.  Sleep with the head of your bed higher than your feet. Use a wedge under the mattress or blocks under the bed frame to raise the head of the bed. Summary  When you have gastroesophageal reflux disease (GERD), food and lifestyle choices are very important in easing your symptoms.  Eat small meals often instead of 3 large meals a day. Eat your meals slowly, and in a place where you are relaxed.  Limit high-fat foods such as fatty meat or fried foods.  Avoid bending over or lying down until 2-3 hours after eating.  Avoid peppermint and spearmint,  caffeine, alcohol, and chocolate. This information is not intended to replace advice given to you by your health care provider. Make sure you discuss any questions you have with your health care provider. Document Revised: 03/16/2019 Document Reviewed: 12/29/2016 Elsevier Patient Education  [place].

## 2020-05-28 ENCOUNTER — Ambulatory Visit: Payer: BC Managed Care – PPO | Admitting: Gastroenterology

## 2020-06-01 DIAGNOSIS — Z113 Encounter for screening for infections with a predominantly sexual mode of transmission: Secondary | ICD-10-CM | POA: Diagnosis not present

## 2020-07-23 ENCOUNTER — Encounter: Payer: Self-pay | Admitting: Physician Assistant

## 2020-07-23 ENCOUNTER — Ambulatory Visit (INDEPENDENT_AMBULATORY_CARE_PROVIDER_SITE_OTHER): Payer: BC Managed Care – PPO | Admitting: Physician Assistant

## 2020-07-23 ENCOUNTER — Other Ambulatory Visit: Payer: Self-pay

## 2020-07-23 VITALS — BP 110/80 | HR 53 | Temp 97.8°F | Resp 16 | Ht 73.0 in | Wt 237.0 lb

## 2020-07-23 DIAGNOSIS — M79672 Pain in left foot: Secondary | ICD-10-CM

## 2020-07-23 DIAGNOSIS — L84 Corns and callosities: Secondary | ICD-10-CM

## 2020-07-23 DIAGNOSIS — B070 Plantar wart: Secondary | ICD-10-CM

## 2020-07-23 DIAGNOSIS — M79671 Pain in right foot: Secondary | ICD-10-CM | POA: Diagnosis not present

## 2020-07-23 NOTE — Progress Notes (Signed)
Patient presents to clinic today c/o pain of lateral feet, bilaterally, mainly in the MTP region. Notes constant slight ache in this area, worsened with pressure from footwear. Denies bruising or swelling. Denies trauma or injury. Denies numbness or skin changes. Also notes a bump of mid plantar surface of L foot with some soreness on walking. No heel or ankle pain.   Past Medical History:  Diagnosis Date  . History of prediabetes     Current Outpatient Medications on File Prior to Visit  Medication Sig Dispense Refill  . pantoprazole (PROTONIX) 40 MG tablet Take 1 tablet (40 mg total) by mouth daily. 30 tablet 3   No current facility-administered medications on file prior to visit.    Allergies  Allergen Reactions  . Penicillins     Diagnosed as a child, does not recall Sx    Family History  Problem Relation Age of Onset  . Cancer Mother   . Breast cancer Sister        65    Social History   Socioeconomic History  . Marital status: Single    Spouse name: Not on file  . Number of children: Not on file  . Years of education: Not on file  . Highest education level: Not on file  Occupational History  . Not on file  Tobacco Use  . Smoking status: Current Every Day Smoker    Packs/day: 0.50    Years: 25.00    Pack years: 12.50  . Smokeless tobacco: Never Used  Vaping Use  . Vaping Use: Never used  Substance and Sexual Activity  . Alcohol use: Yes    Comment: social  . Drug use: Yes    Types: Marijuana    Comment: every day  . Sexual activity: Yes  Other Topics Concern  . Not on file  Social History Narrative   ** Merged History Encounter **       Social Determinants of Health   Financial Resource Strain:   . Difficulty of Paying Living Expenses:   Food Insecurity:   . Worried About Programme researcher, broadcasting/film/video in the Last Year:   . Barista in the Last Year:   Transportation Needs:   . Freight forwarder (Medical):   Marland Kitchen Lack of Transportation  (Non-Medical):   Physical Activity:   . Days of Exercise per Week:   . Minutes of Exercise per Session:   Stress:   . Feeling of Stress :   Social Connections:   . Frequency of Communication with Friends and Family:   . Frequency of Social Gatherings with Friends and Family:   . Attends Religious Services:   . Active Member of Clubs or Organizations:   . Attends Banker Meetings:   Marland Kitchen Marital Status:    Review of Systems - See HPI.  All other ROS are negative.  BP 110/80   Pulse (!) 53   Temp 97.8 F (36.6 C) (Temporal)   Resp 16   Ht 6\' 1"  (1.854 m)   Wt 237 lb (107.5 kg)   SpO2 99%   BMI 31.27 kg/m   Physical Exam Vitals reviewed.  Constitutional:      Appearance: Normal appearance.  Eyes:     Conjunctiva/sclera: Conjunctivae normal.     Pupils: Pupils are equal, round, and reactive to light.  Cardiovascular:     Rate and Rhythm: Normal rate and regular rhythm.  Musculoskeletal:     Cervical back: Neck supple.  Feet:  Neurological:     General: No focal deficit present.     Mental Status: He is alert and oriented to person, place, and time.  Psychiatric:        Mood and Affect: Mood normal.     Recent Results (from the past 2160 hour(s))  CBC with Differential/Platelet     Status: None   Collection Time: 05/27/20  1:53 PM  Result Value Ref Range   WBC 4.5 4.0 - 10.5 K/uL   RBC 4.58 4.22 - 5.81 Mil/uL   Hemoglobin 13.7 13.0 - 17.0 g/dL   HCT 40.9 39 - 52 %   MCV 90.2 78.0 - 100.0 fl   MCHC 33.1 30.0 - 36.0 g/dL   RDW 81.1 91.4 - 78.2 %   Platelets 213.0 150 - 400 K/uL   Neutrophils Relative % 48.7 43 - 77 %   Lymphocytes Relative 37.5 12 - 46 %   Monocytes Relative 8.4 3 - 12 %   Eosinophils Relative 4.4 0 - 5 %   Basophils Relative 1.0 0 - 3 %   Neutro Abs 2.2 1.4 - 7.7 K/uL   Lymphs Abs 1.7 0.7 - 4.0 K/uL   Monocytes Absolute 0.4 0 - 1 K/uL   Eosinophils Absolute 0.2 0 - 0 K/uL   Basophils Absolute 0.0 0 - 0 K/uL  Comprehensive  metabolic panel     Status: None   Collection Time: 05/27/20  1:53 PM  Result Value Ref Range   Sodium 139 135 - 145 mEq/L   Potassium 4.3 3.5 - 5.1 mEq/L   Chloride 106 96 - 112 mEq/L   CO2 29 19 - 32 mEq/L   Glucose, Bld 79 70 - 99 mg/dL   BUN 12 6 - 23 mg/dL   Creatinine, Ser 9.56 0.40 - 1.50 mg/dL   Total Bilirubin 0.8 0.2 - 1.2 mg/dL   Alkaline Phosphatase 60 39 - 117 U/L   AST 17 0 - 37 U/L   ALT 19 0 - 53 U/L   Total Protein 7.0 6.0 - 8.3 g/dL   Albumin 4.2 3.5 - 5.2 g/dL   GFR 21.30 >86.57 mL/min   Calcium 9.4 8.4 - 10.5 mg/dL  Hemoglobin Q4O     Status: None   Collection Time: 05/27/20  1:53 PM  Result Value Ref Range   Hgb A1c MFr Bld 5.4 4.6 - 6.5 %    Comment: Glycemic Control Guidelines for People with Diabetes:Non Diabetic:  <6%Goal of Therapy: <7%Additional Action Suggested:  >8%   TSH     Status: None   Collection Time: 05/27/20  1:53 PM  Result Value Ref Range   TSH 1.45 0.35 - 4.50 uIU/mL  H. pylori antibody, IgG     Status: None   Collection Time: 05/27/20  1:53 PM  Result Value Ref Range   H Pylori IgG Negative Negative    Assessment/Plan: 1. Bilateral foot pain Ongoing. Potential neuromas versus other causes of pain. Supportive measures and OTC pain relievers reviewed. Referral to Podiatry placed.  - Ambulatory referral to Podiatry  2. Plantar wart of left foot Discussed treatment options. He is being referred to Podiatry for evaluation and management of ongoing lateral foot pain bilaterally. Would like to start with a round of cryotherapy. Verbal consent given after explanation of procedure. Cryotherapy applied x 1. Tolerated very well. Home care instructions reviewed.   3. Callus of foot Proper foot wear discussed. Moisturizers recommended. Avoid home scraping of callus. Will defer further management  to his podiatrist.   This visit occurred during the SARS-CoV-2 public health emergency.  Safety protocols were in place, including screening questions  prior to the visit, additional usage of staff PPE, and extensive cleaning of exam room while observing appropriate contact time as indicated for disinfecting solutions.     Piedad Climes, PA-C

## 2020-07-23 NOTE — Patient Instructions (Signed)
Please avoid shoes with a narrow toe box. This is adding pressure to the already painful area. Elevate legs while resting. Tylenol for pain. Can start use of OTC topical Voltaren gel.  You will be contacted for further evaluation and management by Podiatry.  Hang in there!

## 2020-07-24 ENCOUNTER — Encounter: Payer: Self-pay | Admitting: Gastroenterology

## 2020-07-24 ENCOUNTER — Ambulatory Visit (INDEPENDENT_AMBULATORY_CARE_PROVIDER_SITE_OTHER): Payer: BC Managed Care – PPO | Admitting: Gastroenterology

## 2020-07-24 VITALS — BP 110/70 | HR 53 | Ht 73.0 in | Wt 237.0 lb

## 2020-07-24 DIAGNOSIS — K920 Hematemesis: Secondary | ICD-10-CM

## 2020-07-24 MED ORDER — PANTOPRAZOLE SODIUM 40 MG PO TBEC: 40.0000 mg | DELAYED_RELEASE_TABLET | Freq: Two times a day (BID) | ORAL | 3 refills | Status: DC

## 2020-07-24 NOTE — Progress Notes (Signed)
Referring Provider: Waldon Merl, PA-C Primary Care Physician:  Waldon Merl, PA-C  Reason for Consultation:  Hematemesis   IMPRESSION:  Nausea and vomiting with occasional hematemesis Intermittent upper abdominal pain Heartburn controlled on PPI therapy Daily marijuana use HgbA1C 5.4 Unintentional 40 pound weight loss in 8 months Abnormal CT scan: 4.1 cm x 2.2 cm intramuscular hematoma within the abdominal wall, along the left flank with active bleeding  Differential: reflux esophagitis, gastritis, gastroparesis, hyperemesis cannabis, esophageal dysmotility, cyclic vomiting syndrome, malignancy. EGD recommended.     PLAN: Increase pantoprazole to 40 mg BID EGD with esophageal, gastric, and duodenal biopsies Two week holiday off marijuana recommended Follow-up CT scan if symptoms persist and are not explained by EGD  Please see the "Patient Instructions" section for addition details about the plan.  HPI: Chad Morris is a 44 y.o. male referred by PA Daphine Deutscher for further evaluation of postprandial nausea and vomiting. The history is obtained through the patient, review of his electronic health record, and his family that accompanies him.  He has diabetes and depression. His family is concerned about his health. He is a driver of 6 wheeler trucks.  He has not had the Covid vaccine.   GERD x 2 years using OTC omeprazole daily in the past. Taking pantoprazole more recently with adequate control of his heartburn symptoms.   Presents day with at least 6 months of post-prandial nausea and vomiting with intermittent hematemesis, occasional upper abdominal pain, and frequent regurgitation of partially digested food. Symptoms so bad that he was seen in the ED 03/17/20.  No dysphagia, odynophagia, or globus. No sore throat, cough, or neck pain. Poor appetite with some sitophobia. Lost 40 pounds since December.  Ginger ale provides some relief. No relief with hot showers.    Drinks tequila. Smokes marijuana daily in part to control the symptoms, but he started smoking long before symptoms started. Denies other street drugs.   Labs 03/17/20: normal CBC, TB 1.6 other liver enzymes normal CT abd/pelvis with contrast 03/17/20 showed 4.1 cm x 2.2 cm intramuscular hematoma within the abdominal wall, along the left flank, with additional findings suggestive of active bleeding. Labs 05/27/20: normal CBC, H pylori IgG negative, normal TSH, normal HgbA1C  No personal or family history of migraines. No known family history of colon cancer or polyps. No family history of uterine/endometrial cancer, pancreatic cancer or gastric/stomach cancer.   Past Medical History:  Diagnosis Date  . Depression   . Diabetes (HCC)    diet controlled    Past Surgical History:  Procedure Laterality Date  . FOOT SURGERY Right    right 5th digit    Current Outpatient Medications  Medication Sig Dispense Refill  . pantoprazole (PROTONIX) 40 MG tablet Take 1 tablet (40 mg total) by mouth daily. 30 tablet 3   No current facility-administered medications for this visit.    Allergies as of 07/24/2020 - Review Complete 07/24/2020  Allergen Reaction Noted  . Penicillins  01/31/2020    Family History  Problem Relation Age of Onset  . Leukemia Mother   . Breast cancer Sister        44  . Colon cancer Neg Hx   . Esophageal cancer Neg Hx   . Rectal cancer Neg Hx     Social History   Socioeconomic History  . Marital status: Married    Spouse name: Not on file  . Number of children: 3  . Years of education: Not on file  .  Highest education level: Not on file  Occupational History  . Not on file  Tobacco Use  . Smoking status: Former Smoker    Packs/day: 0.50    Years: 25.00    Pack years: 12.50  . Smokeless tobacco: Never Used  . Tobacco comment: quit 07/23/2020  Vaping Use  . Vaping Use: Never used  Substance and Sexual Activity  . Alcohol use: Yes    Comment: social   . Drug use: Yes    Types: Marijuana    Comment: every day  . Sexual activity: Yes  Other Topics Concern  . Not on file  Social History Narrative   ** Merged History Encounter **       Social Determinants of Health   Financial Resource Strain:   . Difficulty of Paying Living Expenses:   Food Insecurity:   . Worried About Programme researcher, broadcasting/film/video in the Last Year:   . Barista in the Last Year:   Transportation Needs:   . Freight forwarder (Medical):   Marland Kitchen Lack of Transportation (Non-Medical):   Physical Activity:   . Days of Exercise per Week:   . Minutes of Exercise per Session:   Stress:   . Feeling of Stress :   Social Connections:   . Frequency of Communication with Friends and Family:   . Frequency of Social Gatherings with Friends and Family:   . Attends Religious Services:   . Active Member of Clubs or Organizations:   . Attends Banker Meetings:   Marland Kitchen Marital Status:   Intimate Partner Violence:   . Fear of Current or Ex-Partner:   . Emotionally Abused:   Marland Kitchen Physically Abused:   . Sexually Abused:      Physical Exam: General:   Alert,  well-nourished, pleasant and cooperative in NAD. Patient smells strongly of marijuana.  Head:  Normocephalic and atraumatic. Eyes:  Sclera clear, no icterus.   Conjunctiva pink. Ears:  Normal auditory acuity. Nose:  No deformity, discharge,  or lesions. Mouth:  No deformity or lesions.   Neck:  Supple; no masses or thyromegaly. Lungs:  Clear throughout to auscultation.   No wheezes. Heart:  Regular rate and rhythm; no murmurs. Abdomen:  Soft, nontender, nondistended, normal bowel sounds, no rebound or guarding. No hepatosplenomegaly.   Rectal:  Deferred  Msk:  Symmetrical. No boney deformities LAD: No inguinal or umbilical LAD Extremities:  No clubbing or edema. Neurologic:  Alert and  oriented x4;  grossly nonfocal Skin:  Intact without significant lesions or rashes. Psych:  Alert and cooperative. Normal  mood and affect.    Viktoriya Glaspy L. Orvan Falconer, MD, MPH 07/24/2020, 9:25 AM

## 2020-07-24 NOTE — Patient Instructions (Addendum)
Avoid any foods that make you feel nauseated. This may include spicy, strong-smelling, and high fat foods. You might find cold, bland foods easier to eat without feeling nauseated.  Try using ginger products such as tea, ginger tablets, or ginger ale to help with the nausea.  Eat frequent, small, high calorie meals and snacks. Hunger can make the feelings of nausea stronger.  Sit upright or recline with head elevated for at least 30-60 minutes after meals.   Good oral hygiene with frequent tooth brushing can help reduce unpleasant mouth tastes contributing to nausea.  Apply a cool damp cloth on your neck or forehead if you are very nauseous.   Relaxation, imagery, acupressure, and acupuncture may all provide some relief. Keep an open mind and try them.   Although you are using marijuana to treat the nausea, this may actually be the cause or trigger. I recommend not using marijuana for 2 weeks to see if this improves your symptoms.   I have recommended an upper endoscopy with biopsies for further evaluation. Please increase your pantoprazole to 40 mg twice daily as needed.

## 2020-07-25 DIAGNOSIS — Z20822 Contact with and (suspected) exposure to covid-19: Secondary | ICD-10-CM | POA: Diagnosis not present

## 2020-07-29 ENCOUNTER — Encounter: Payer: Self-pay | Admitting: Gastroenterology

## 2020-07-30 ENCOUNTER — Other Ambulatory Visit: Payer: Self-pay | Admitting: Gastroenterology

## 2020-07-30 ENCOUNTER — Ambulatory Visit (INDEPENDENT_AMBULATORY_CARE_PROVIDER_SITE_OTHER): Payer: BC Managed Care – PPO

## 2020-07-30 ENCOUNTER — Other Ambulatory Visit: Payer: Self-pay

## 2020-07-30 DIAGNOSIS — Z1159 Encounter for screening for other viral diseases: Secondary | ICD-10-CM | POA: Diagnosis not present

## 2020-07-31 LAB — SARS CORONAVIRUS 2 (TAT 6-24 HRS): SARS Coronavirus 2: NEGATIVE

## 2020-08-01 ENCOUNTER — Other Ambulatory Visit: Payer: Self-pay

## 2020-08-01 ENCOUNTER — Ambulatory Visit (AMBULATORY_SURGERY_CENTER): Payer: BC Managed Care – PPO | Admitting: Gastroenterology

## 2020-08-01 ENCOUNTER — Encounter: Payer: Self-pay | Admitting: Gastroenterology

## 2020-08-01 VITALS — BP 117/75 | HR 44 | Temp 98.4°F | Resp 14 | Ht 73.0 in | Wt 237.0 lb

## 2020-08-01 DIAGNOSIS — K2970 Gastritis, unspecified, without bleeding: Secondary | ICD-10-CM

## 2020-08-01 DIAGNOSIS — K219 Gastro-esophageal reflux disease without esophagitis: Secondary | ICD-10-CM

## 2020-08-01 DIAGNOSIS — K449 Diaphragmatic hernia without obstruction or gangrene: Secondary | ICD-10-CM | POA: Diagnosis not present

## 2020-08-01 DIAGNOSIS — K228 Other specified diseases of esophagus: Secondary | ICD-10-CM | POA: Diagnosis not present

## 2020-08-01 DIAGNOSIS — R1110 Vomiting, unspecified: Secondary | ICD-10-CM | POA: Diagnosis not present

## 2020-08-01 DIAGNOSIS — K920 Hematemesis: Secondary | ICD-10-CM

## 2020-08-01 MED ORDER — SODIUM CHLORIDE 0.9 % IV SOLN: 500.0000 mL | Freq: Once | INTRAVENOUS | Status: DC

## 2020-08-01 NOTE — Op Note (Signed)
[facility] Patient Name: Chad Morris Procedure Date: 08/01/2020 2:37 PM MRN: [id number] Endoscopist: Tressia DanasKimberly Jeromey Kruer MD, MD Age: 44 Referring MD:  Date of Birth: 1976/09/26 Gender: Male Account #: [id number] Procedure:                Upper GI endoscopy Indications:              Nausea and vomiting with occasional hematemesi                           Intermittent upper abdominal pain                           Heartburn controlled on PPI therapy                           Unintentional 40 pound weight loss in 8 months Medicines:                Monitored Anesthesia Care Procedure:                Pre-Anesthesia Assessment:                           - Prior to the procedure, a History and Physical                            was performed, and patient medications and                            allergies were reviewed. The patient's tolerance of                            previous anesthesia was also reviewed. The risks                            and benefits of the procedure and the sedation                            options and risks were discussed with the patient.                            All questions were answered, and informed consent                            was obtained. Prior Anticoagulants: The patient has                            taken no previous anticoagulant or antiplatelet                            agents. ASA Grade Assessment: III - A patient with                            severe systemic disease. After reviewing the risks  and benefits, the patient was deemed in                            satisfactory condition to undergo the procedure.                           After obtaining informed consent, the endoscope was                            passed under direct vision. Throughout the                            procedure, the patient's blood pressure, pulse, and                            oxygen saturations were monitored  continuously. The                            Endoscope was introduced through the mouth, and                            advanced to the third part of duodenum. The upper                            GI endoscopy was accomplished without difficulty.                            The patient tolerated the procedure well. Scope In: Scope Out: Findings:                 LA Grade A (one or more mucosal breaks less than 5                            mm, not extending between tops of 2 mucosal folds)                            esophagitis with no bleeding was found. Biopsies                            were taken from the mid/proximal and distal                            esophagus with a cold forceps for histology.                            Estimated blood loss was minimal.                           A small hiatal hernia is present. Mild gastritis in                            the antrum. The entire examined stomach was  otherwise normal. Biopsies were taken from the                            antrum, body, and fundus with a cold forceps for                            histology. Estimated blood loss was minimal.                           The examined duodenum was normal. Biopsies were                            taken with a cold forceps for histology. Estimated                            blood loss was minimal.                           The cardia and gastric fundus were normal on                            retroflexion.                           The exam was otherwise without abnormality. Complications:            No immediate complications. Estimated blood loss:                            Minimal. Estimated Blood Loss:     Estimated blood loss was minimal. Impression:               - LA Grade A reflux esophagitis with no bleeding.                            Biopsied.                           - Normal stomach. Biopsied.                           - Normal examined  duodenum. Biopsied.                           - The examination was otherwise normal. Recommendation:           - Patient has a contact number available for                            emergencies. The signs and symptoms of potential                            delayed complications were discussed with the                            patient. Return to normal activities tomorrow.  Written discharge instructions were provided to the                            patient.                           - Resume previous diet.                           - Continue present medications including                            pantoprazole 40 mg twice daily.                           - Await pathology results.                           - Follow-up in the office to review these results. Tressia Danas MD, MD 08/01/2020 2:58:56 PM This report has been signed electronically.

## 2020-08-01 NOTE — Progress Notes (Signed)
Called to room to assist during endoscopic procedure.  Patient ID and intended procedure confirmed with present staff. Received instructions for my participation in the procedure from the performing physician.  

## 2020-08-01 NOTE — Progress Notes (Signed)
robinol antisialogogue  Lidocaine   buffer 

## 2020-08-01 NOTE — Progress Notes (Signed)
A and O x3. Report to RN. Tolerated MAC anesthesia well.Teeth unchanged after procedure.

## 2020-08-01 NOTE — Patient Instructions (Signed)
YOU HAD AN ENDOSCOPIC PROCEDURE TODAY AT THE [facility]:   Refer to the procedure report that was given to you for any specific questions about what was found during the examination.  If the procedure report does not answer your questions, please call your gastroenterologist to clarify.  If you requested that your care partner not be given the details of your procedure findings, then the procedure report has been included in a sealed envelope for you to review at your convenience later.  YOU SHOULD EXPECT: Some feelings of bloating in the abdomen. Passage of more gas than usual.  Walking can help get rid of the air that was put into your GI tract during the procedure and reduce the bloating. If you had a lower endoscopy (such as a colonoscopy or flexible sigmoidoscopy) you may notice spotting of blood in your stool or on the toilet paper. If you underwent a bowel prep for your procedure, you may not have a normal bowel movement for a few days.  Please Note:  You might notice some irritation and congestion in your nose or some drainage.  This is from the oxygen used during your procedure.  There is no need for concern and it should clear up in a day or so.  SYMPTOMS TO REPORT IMMEDIATELY:    Following upper endoscopy (EGD)  Vomiting of blood or coffee ground material  New chest pain or pain under the shoulder blades  Painful or persistently difficult swallowing  New shortness of breath  Fever of 100F or higher  Black, tarry-looking stools  For urgent or emergent issues, a gastroenterologist can be reached at any hour by calling [phone]. Do not use MyChart messaging for urgent concerns.    DIET:  We do recommend a small meal at first, but then you may proceed to your regular diet.  Drink plenty of fluids but you should avoid alcoholic beverages for 24 hours.  ACTIVITY:  You should plan to take it easy for the rest of today and you should NOT DRIVE or use heavy machinery  until tomorrow (because of the sedation medicines used during the test).    FOLLOW UP: Our staff will call the number listed on your records 48-72 hours following your procedure to check on you and address any questions or concerns that you may have regarding the information given to you following your procedure. If we do not reach you, we will leave a message.  We will attempt to reach you two times.  During this call, we will ask if you have developed any symptoms of COVID 19. If you develop any symptoms (ie: fever, flu-like symptoms, shortness of breath, cough etc.) before then, please call [phone].  If you test positive for Covid 19 in the 2 weeks post procedure, please call and report this information to us.    If any biopsies were taken you will be contacted by phone or by letter within the next 1-3 weeks.  Please call us at [phone] if you have not heard about the biopsies in 3 weeks.    SIGNATURES/CONFIDENTIALITY: You and/or your care partner have signed paperwork which will be entered into your electronic medical record.  These signatures attest to the fact that that the information above on your After Visit Summary has been reviewed and is understood.  Full responsibility of the confidentiality of this discharge information lies with you and/or your care-partner. 

## 2020-08-01 NOTE — Progress Notes (Signed)
VS AG °

## 2020-08-05 ENCOUNTER — Telehealth: Payer: Self-pay

## 2020-08-05 NOTE — Telephone Encounter (Signed)
  Follow up Call-  Call back number 08/01/2020  Post procedure Call Back phone  # [phone]  Permission to leave phone message Yes  Some recent data might be hidden     Patient questions:  Do you have a fever, pain , or abdominal swelling? No. Pain Score  0 *  Have you tolerated food without any problems? Yes.    Have you been able to return to your normal activities? Yes.    Do you have any questions about your discharge instructions: Diet   No. Medications  No. Follow up visit  No.  Do you have questions or concerns about your Care? No.  Actions: * If pain score is 4 or above: No action needed, pain <4.  1. Have you developed a fever since your procedure? no  2.   Have you had an respiratory symptoms (SOB or cough) since your procedure? no  3.   Have you tested positive for COVID 19 since your procedure no  4.   Have you had any family members/close contacts diagnosed with the COVID 19 since your procedure?  no   If yes to any of these questions please route to Laverna Peace, RN and Karlton Lemon, RN

## 2020-08-20 DIAGNOSIS — Z20822 Contact with and (suspected) exposure to covid-19: Secondary | ICD-10-CM | POA: Diagnosis not present

## 2020-08-21 ENCOUNTER — Ambulatory Visit (INDEPENDENT_AMBULATORY_CARE_PROVIDER_SITE_OTHER): Payer: BC Managed Care – PPO | Admitting: Physician Assistant

## 2020-08-21 ENCOUNTER — Ambulatory Visit (INDEPENDENT_AMBULATORY_CARE_PROVIDER_SITE_OTHER): Payer: BC Managed Care – PPO

## 2020-08-21 ENCOUNTER — Other Ambulatory Visit: Payer: Self-pay | Admitting: Podiatry

## 2020-08-21 ENCOUNTER — Encounter: Payer: Self-pay | Admitting: Physician Assistant

## 2020-08-21 ENCOUNTER — Other Ambulatory Visit: Payer: Self-pay

## 2020-08-21 ENCOUNTER — Ambulatory Visit (INDEPENDENT_AMBULATORY_CARE_PROVIDER_SITE_OTHER): Payer: BC Managed Care – PPO | Admitting: Podiatry

## 2020-08-21 ENCOUNTER — Encounter: Payer: Self-pay | Admitting: Podiatry

## 2020-08-21 VITALS — BP 112/78 | HR 68 | Temp 98.1°F | Resp 16 | Ht 73.0 in | Wt 229.0 lb

## 2020-08-21 DIAGNOSIS — M2042 Other hammer toe(s) (acquired), left foot: Secondary | ICD-10-CM | POA: Diagnosis not present

## 2020-08-21 DIAGNOSIS — M79672 Pain in left foot: Secondary | ICD-10-CM

## 2020-08-21 DIAGNOSIS — M778 Other enthesopathies, not elsewhere classified: Secondary | ICD-10-CM

## 2020-08-21 DIAGNOSIS — M79671 Pain in right foot: Secondary | ICD-10-CM

## 2020-08-21 DIAGNOSIS — H6123 Impacted cerumen, bilateral: Secondary | ICD-10-CM

## 2020-08-21 DIAGNOSIS — L989 Disorder of the skin and subcutaneous tissue, unspecified: Secondary | ICD-10-CM

## 2020-08-21 NOTE — Progress Notes (Addendum)
Patient presents to clinic today c/o ear fullness with decreased hearing bilaterally. Notes history of ear wax impaction warranting ear lavage a few times per year. Denies ear pain or drainage.   Past Medical History:  Diagnosis Date  . Depression   . Diabetes (HCC)    diet controlled  . GERD (gastroesophageal reflux disease)     Current Outpatient Medications on File Prior to Visit  Medication Sig Dispense Refill  . metFORMIN (GLUCOPHAGE) 500 MG tablet Take by mouth.    . pantoprazole (PROTONIX) 40 MG tablet Take 1 tablet (40 mg total) by mouth 2 (two) times daily. 60 tablet 3   No current facility-administered medications on file prior to visit.    Allergies  Allergen Reactions  . Penicillins     Diagnosed as a child, does not recall Sx    Family History  Problem Relation Age of Onset  . Leukemia Mother   . Breast cancer Sister        76  . Colon cancer Neg Hx   . Esophageal cancer Neg Hx   . Rectal cancer Neg Hx   . Stomach cancer Neg Hx     Social History   Socioeconomic History  . Marital status: Married    Spouse name: Not on file  . Number of children: 3  . Years of education: Not on file  . Highest education level: Not on file  Occupational History  . Not on file  Tobacco Use  . Smoking status: Former Smoker    Packs/day: 0.50    Years: 25.00    Pack years: 12.50  . Smokeless tobacco: Never Used  . Tobacco comment: quit 07/23/2020  Vaping Use  . Vaping Use: Never used  Substance and Sexual Activity  . Alcohol use: Yes    Comment: social  . Drug use: Yes    Types: Marijuana    Comment: every day, last used 8/26 pm  . Sexual activity: Yes  Other Topics Concern  . Not on file  Social History Narrative   ** Merged History Encounter **       Social Determinants of Health   Financial Resource Strain:   . Difficulty of Paying Living Expenses: Not on file  Food Insecurity:   . Worried About Programme researcher, broadcasting/film/video in the Last Year: Not on file    . Ran Out of Food in the Last Year: Not on file  Transportation Needs:   . Lack of Transportation (Medical): Not on file  . Lack of Transportation (Non-Medical): Not on file  Physical Activity:   . Days of Exercise per Week: Not on file  . Minutes of Exercise per Session: Not on file  Stress:   . Feeling of Stress : Not on file  Social Connections:   . Frequency of Communication with Friends and Family: Not on file  . Frequency of Social Gatherings with Friends and Family: Not on file  . Attends Religious Services: Not on file  . Active Member of Clubs or Organizations: Not on file  . Attends Banker Meetings: Not on file  . Marital Status: Not on file   Review of Systems - See HPI.  All other ROS are negative.  BP 112/78   Pulse 68   Temp 98.1 F (36.7 C) (Temporal)   Resp 16   Ht 6\' 1"  (1.854 m)   Wt 229 lb (103.9 kg)   SpO2 98%   BMI 30.21 kg/m   Physical  Exam Vitals reviewed.  Constitutional:      Appearance: Normal appearance.  HENT:     Head: Normocephalic and atraumatic.     Right Ear: There is impacted cerumen.     Left Ear: There is impacted cerumen.  Musculoskeletal:     Cervical back: Neck supple.  Neurological:     Mental Status: He is alert.     Recent Results (from the past 2160 hour(s))  CBC with Differential/Platelet     Status: None   Collection Time: 05/27/20  1:53 PM  Result Value Ref Range   WBC 4.5 4.0 - 10.5 K/uL   RBC 4.58 4.22 - 5.81 Mil/uL   Hemoglobin 13.7 13.0 - 17.0 g/dL   HCT 43.1 39 - 52 %   MCV 90.2 78.0 - 100.0 fl   MCHC 33.1 30.0 - 36.0 g/dL   RDW 54.0 08.6 - 76.1 %   Platelets 213.0 150 - 400 K/uL   Neutrophils Relative % 48.7 43 - 77 %   Lymphocytes Relative 37.5 12 - 46 %   Monocytes Relative 8.4 3 - 12 %   Eosinophils Relative 4.4 0 - 5 %   Basophils Relative 1.0 0 - 3 %   Neutro Abs 2.2 1.4 - 7.7 K/uL   Lymphs Abs 1.7 0.7 - 4.0 K/uL   Monocytes Absolute 0.4 0 - 1 K/uL   Eosinophils Absolute 0.2 0 - 0  K/uL   Basophils Absolute 0.0 0 - 0 K/uL  Comprehensive metabolic panel     Status: None   Collection Time: 05/27/20  1:53 PM  Result Value Ref Range   Sodium 139 135 - 145 mEq/L   Potassium 4.3 3.5 - 5.1 mEq/L   Chloride 106 96 - 112 mEq/L   CO2 29 19 - 32 mEq/L   Glucose, Bld 79 70 - 99 mg/dL   BUN 12 6 - 23 mg/dL   Creatinine, Ser 9.50 0.40 - 1.50 mg/dL   Total Bilirubin 0.8 0.2 - 1.2 mg/dL   Alkaline Phosphatase 60 39 - 117 U/L   AST 17 0 - 37 U/L   ALT 19 0 - 53 U/L   Total Protein 7.0 6.0 - 8.3 g/dL   Albumin 4.2 3.5 - 5.2 g/dL   GFR 93.26 >71.24 mL/min   Calcium 9.4 8.4 - 10.5 mg/dL  Hemoglobin P8K     Status: None   Collection Time: 05/27/20  1:53 PM  Result Value Ref Range   Hgb A1c MFr Bld 5.4 4.6 - 6.5 %    Comment: Glycemic Control Guidelines for People with Diabetes:Non Diabetic:  <6%Goal of Therapy: <7%Additional Action Suggested:  >8%   TSH     Status: None   Collection Time: 05/27/20  1:53 PM  Result Value Ref Range   TSH 1.45 0.35 - 4.50 uIU/mL  H. pylori antibody, IgG     Status: None   Collection Time: 05/27/20  1:53 PM  Result Value Ref Range   H Pylori IgG Negative Negative  SARS Coronavirus 2 (TAT 6-24 hrs)     Status: None   Collection Time: 07/30/20 12:00 AM  Result Value Ref Range   SARS Coronavirus 2 RESULT: NEGATIVE     Comment: RESULT: NEGATIVESARS-CoV-2 INTERPRETATION:A NEGATIVE  test result means that SARS-CoV-2 RNA was not present in the specimen above the limit of detection of this test. This does not preclude a possible SARS-CoV-2 infection and should not be used as the  sole basis for patient management decisions. Negative  results must be combined with clinical observations, patient history, and epidemiological information. Optimum specimen types and timing for peak viral levels during infections caused by SARS-CoV-2  have not been determined. Collection of multiple specimens or types of specimens may be necessary to detect virus. Improper  specimen collection and handling, sequence variability under primers/probes, or organism present below the limit of detection may  lead to false negative results. Positive and negative predictive values of testing are highly dependent on prevalence. False negative test results are more likely when prevalence of disease is high.The expected result is NEGATIVE.Fact S heet for  Healthcare Providers: [employer] Sheet for Patients: https://[facility].org/ Reference Range - Negative     Assessment/Plan: 1. Bilateral impacted cerumen Unable to remove via curette. Successfully removed with lavage bilaterally with resolution of symptoms after verbal consent obtained. Patient tolerated very well. Home care discussed.     Piedad Climes, PA-C

## 2020-08-21 NOTE — Patient Instructions (Signed)
Earwax Buildup, Adult The ears produce a substance called earwax that helps keep bacteria out of the ear and protects the skin in the ear canal. Occasionally, earwax can build up in the ear and cause discomfort or hearing loss. What increases the risk? This condition is more likely to develop in people who:  Are male.  Are elderly.  Naturally produce more earwax.  Clean their ears often with cotton swabs.  Use earplugs often.  Use in-ear headphones often.  Wear hearing aids.  Have narrow ear canals.  Have earwax that is overly thick or sticky.  Have eczema.  Are dehydrated.  Have excess hair in the ear canal. What are the signs or symptoms? Symptoms of this condition include:  Reduced or muffled hearing.  A feeling of fullness in the ear or feeling that the ear is plugged.  Fluid coming from the ear.  Ear pain.  Ear itch.  Ringing in the ear.  Coughing.  An obvious piece of earwax that can be seen inside the ear canal. How is this diagnosed? This condition may be diagnosed based on:  Your symptoms.  Your medical history.  An ear exam. During the exam, your health care provider will look into your ear with an instrument called an otoscope. You may have tests, including a hearing test. How is this treated? This condition may be treated by:  Using ear drops to soften the earwax.  Having the earwax removed by a health care provider. The health care provider may: ? Flush the ear with water. ? Use an instrument that has a loop on the end (curette). ? Use a suction device.  Surgery to remove the wax buildup. This may be done in severe cases. Follow these instructions at home:   Take over-the-counter and prescription medicines only as told by your health care provider.  Do not put any objects, including cotton swabs, into your ear. You can clean the opening of your ear canal with a washcloth or facial tissue.  Follow instructions from your health care  provider about cleaning your ears. Do not over-clean your ears.  Drink enough fluid to keep your urine clear or pale yellow. This will help to thin the earwax.  Keep all follow-up visits as told by your health care provider. If earwax builds up in your ears often or if you use hearing aids, consider seeing your health care provider for routine, preventive ear cleanings. Ask your health care provider how often you should schedule your cleanings.  If you have hearing aids, clean them according to instructions from the manufacturer and your health care provider. Contact a health care provider if:  You have ear pain.  You develop a fever.  You have blood, pus, or other fluid coming from your ear.  You have hearing loss.  You have ringing in your ears that does not go away.  Your symptoms do not improve with treatment.  You feel like the room is spinning (vertigo). Summary  Earwax can build up in the ear and cause discomfort or hearing loss.  The most common symptoms of this condition include reduced or muffled hearing and a feeling of fullness in the ear or feeling that the ear is plugged.  This condition may be diagnosed based on your symptoms, your medical history, and an ear exam.  This condition may be treated by using ear drops to soften the earwax or by having the earwax removed by a health care provider.  Do not put any   objects, including cotton swabs, into your ear. You can clean the opening of your ear canal with a washcloth or facial tissue. This information is not intended to replace advice given to you by your health care provider. Make sure you discuss any questions you have with your health care provider. Document Revised: 11/05/2017 Document Reviewed: 02/03/2017 Elsevier Patient Education  2020 [employer]  

## 2020-08-21 NOTE — Progress Notes (Signed)
Subjective:   Patient ID: Chad Morris, male   DOB: 44 y.o.   MRN: [id number]   HPI Patient presents stating he has had this severe lesion between the fourth and fifth toes on his left one and he had a bone removed on the right which helped even though the toes floppy.  Also complains of a very painful lesion on the bottom of the left foot stating that its been there for around 6 months.  Patient is tried to trim things out soaks without relief of symptoms patient does not smoke likes to be active   Review of Systems  All other systems reviewed and are negative.       Objective:  Physical Exam Vitals and nursing note reviewed.  Constitutional:      Appearance: He is well-developed.  Pulmonary:     Effort: Pulmonary effort is normal.  Musculoskeletal:        General: Normal range of motion.  Skin:    General: Skin is warm.  Neurological:     Mental Status: He is alert.     Neurovascular status intact muscle strength found to be adequate range of motion within normal limits.  Patient is noted to have a very thick painful lesion fourth interspace left foot that is very painful when pressed and is noted to have a lesion on the plantar aspect left proximal foot lateral side that is painful when pressed and has been present for around 6 months     Assessment:  Chronic interspace lesion left with pain along with bone hitting bone between the fourth and fifth digit with history of surgery on the right and lesion plantar left that is very painful when palpated in the mid plantar arch region lateral side     Plan:  H&P reviewed both conditions and x-rays.  Today I discussed a partial syndactylization with arthroplasty digit 5 left and patient wants to get this done and is motivated to do it quickly.  I then discussed the plantar lesion and I can attempt to do wide excision cutting out at this lesion even though there is no guarantee will not recur and patient is aware of this.  Patient  wants surgery and needs to get it done soon and I allowed him to go over consent form reviewing at great length alternative treatments complications and the fact that total recovery can take 4 months to 6 months.  Patient scheduled for outpatient surgery is encouraged to call with questions prior to the procedure and will have syndactyly along with arthroplasty and excision of plantar lesion  X-rays indicate there is enlargement of the head of the proximal phalanx digit 5 left and there was previous arthroplasty digit 5 right confirming the problem

## 2020-08-23 DIAGNOSIS — R590 Localized enlarged lymph nodes: Secondary | ICD-10-CM | POA: Diagnosis not present

## 2020-08-26 ENCOUNTER — Ambulatory Visit: Payer: BC Managed Care – PPO | Admitting: Gastroenterology

## 2020-08-27 ENCOUNTER — Ambulatory Visit (INDEPENDENT_AMBULATORY_CARE_PROVIDER_SITE_OTHER): Payer: BC Managed Care – PPO | Admitting: Gastroenterology

## 2020-08-27 ENCOUNTER — Encounter: Payer: Self-pay | Admitting: Gastroenterology

## 2020-08-27 VITALS — BP 100/70 | HR 72 | Ht 72.5 in | Wt 230.2 lb

## 2020-08-27 DIAGNOSIS — K2100 Gastro-esophageal reflux disease with esophagitis, without bleeding: Secondary | ICD-10-CM | POA: Diagnosis not present

## 2020-08-27 DIAGNOSIS — K209 Esophagitis, unspecified without bleeding: Secondary | ICD-10-CM | POA: Insufficient documentation

## 2020-08-27 NOTE — Progress Notes (Signed)
     08/27/2020 DEQUINCY BORN [id number] [birth year]   HISTORY OF PRESENT ILLNESS: This is a 44 year old male who is recently known to Dr. Orvan Falconer for complaints of nausea, vomiting, hematemesis.  He underwent EGD on August 26 and was found to have hiatal hernia, esophagitis, gastritis.  Biopsies just confirmed reflux esophagitis.  Gastric and duodenal biopsies were normal, no H. pylori.  He has been on pantoprazole 40 mg twice daily.  He presents here today for follow-up.  He says that he feels well.  Says he feels about 90% better.  He admits that he has not discontinued his marijuana use at all to see if that would allow improvement in his symptoms.   Past Medical History:  Diagnosis Date  . Depression   . Diabetes (HCC)    diet controlled  . GERD (gastroesophageal reflux disease)   . Hiatal hernia    Past Surgical History:  Procedure Laterality Date  . FOOT SURGERY Right    right 5th digit  . UPPER GASTROINTESTINAL ENDOSCOPY      reports that he has quit smoking. He has a 12.50 pack-year smoking history. He has never used smokeless tobacco. He reports current alcohol use. He reports current drug use. Drug: Marijuana. family history includes Breast cancer in his sister; Leukemia in his mother. Allergies  Allergen Reactions  . Penicillins     Diagnosed as a child, does not recall Sx      Outpatient Encounter Medications as of 08/27/2020  Medication Sig  . metFORMIN (GLUCOPHAGE) 500 MG tablet Take by mouth.  . pantoprazole (PROTONIX) 40 MG tablet Take 1 tablet (40 mg total) by mouth 2 (two) times daily.   No facility-administered encounter medications on file as of 08/27/2020.   REVIEW OF SYSTEMS  : All other systems reviewed and negative except where noted in the History of Present Illness.  PHYSICAL EXAM: BP 100/70 (BP Location: Left Arm, Patient Position: Sitting, Cuff Size: Normal)   Pulse 72   Ht 6' 0.5" (1.842 m) Comment: height measured without shoes  Wt 230 lb  4 oz (104.4 kg)   BMI 30.80 kg/m  General: Well developed AA male in no acute distress Head: Normocephalic and atraumatic Eyes:  Sclerae anicteric, conjunctiva pink. Ears: Normal auditory acuity  Lungs: Clear throughout to auscultation; no W/R/R. Heart: Regular rate and rhythm; no M/R/G. Abdomen: Soft, non-distended.  BS present.  Non-tender. Musculoskeletal: Symmetrical with no gross deformities  Skin: No lesions on visible extremities Extremities: No edema  Neurological: Alert oriented x 4, grossly non-focal Psychological:  Alert and cooperative. Normal mood and affect  ASSESSMENT AND PLAN: *GERD with esophagitis:  Symptoms much better on pantoprazole 40 mg BID.  Will continue for now.  Will follow-up in a year for further refills or sooner if needed for any other issues.  He was advised that if symptoms worsen again then to discontinue marijuana use for several weeks to see if symptoms improve.   CC:  Waldon Merl, PA-C

## 2020-08-27 NOTE — Patient Instructions (Signed)
If you are age 42 or older, your body mass index should be between 23-30. Your Body mass index is 30.8 kg/m. If this is out of the aforementioned range listed, please consider follow up with your Primary Care Provider.  If you are age 66 or younger, your body mass index should be between 19-25. Your Body mass index is 30.8 kg/m. If this is out of the aformentioned range listed, please consider follow up with your Primary Care Provider.   Call the office to schedule a follow up in 1 year.

## 2020-08-27 NOTE — Progress Notes (Signed)
Reviewed and agree with management plans. ? ?Maryetta Shafer L. Aditi Rovira, MD, MPH  ?

## 2020-08-28 ENCOUNTER — Telehealth: Payer: Self-pay

## 2020-08-28 NOTE — Telephone Encounter (Signed)
DOS 09/03/2020  EXC BENIGN LESION LT - 11426 HAMMERTOE REPAIR 5TH LT - 28285 WEBBING PROCEDURE 4TH LT - 28280  BCBS EFFECTIVE DATE - 12/08/2019  PLAN DEDUCTIBLE - $4000.00 W/ $4000.00 REMAINING OUT OF POCKET -  $5000.00 W/ $0.00 REMAINING COPAY $0.0 COINSURANCE - 20%  NO AUTH REQUIRED PER WEBSITE

## 2020-09-02 MED ORDER — HYDROCODONE-ACETAMINOPHEN 10-325 MG PO TABS: 1.0000 | ORAL_TABLET | Freq: Three times a day (TID) | ORAL | 0 refills | Status: AC | PRN

## 2020-09-02 NOTE — Addendum Note (Signed)
Addended by: Lenn Sink on: 09/02/2020 06:13 PM   Modules accepted: Orders

## 2020-09-03 ENCOUNTER — Encounter: Payer: Self-pay | Admitting: Podiatry

## 2020-09-03 DIAGNOSIS — M79674 Pain in right toe(s): Secondary | ICD-10-CM | POA: Diagnosis not present

## 2020-09-03 DIAGNOSIS — M205X2 Other deformities of toe(s) (acquired), left foot: Secondary | ICD-10-CM | POA: Diagnosis not present

## 2020-09-03 DIAGNOSIS — D2372 Other benign neoplasm of skin of left lower limb, including hip: Secondary | ICD-10-CM | POA: Diagnosis not present

## 2020-09-03 DIAGNOSIS — M2042 Other hammer toe(s) (acquired), left foot: Secondary | ICD-10-CM | POA: Diagnosis not present

## 2020-09-03 DIAGNOSIS — M2012 Hallux valgus (acquired), left foot: Secondary | ICD-10-CM | POA: Diagnosis not present

## 2020-09-03 DIAGNOSIS — D2371 Other benign neoplasm of skin of right lower limb, including hip: Secondary | ICD-10-CM | POA: Diagnosis not present

## 2020-09-03 DIAGNOSIS — M205X1 Other deformities of toe(s) (acquired), right foot: Secondary | ICD-10-CM | POA: Diagnosis not present

## 2020-09-03 DIAGNOSIS — L989 Disorder of the skin and subcutaneous tissue, unspecified: Secondary | ICD-10-CM | POA: Diagnosis not present

## 2020-09-11 ENCOUNTER — Ambulatory Visit (INDEPENDENT_AMBULATORY_CARE_PROVIDER_SITE_OTHER): Payer: BC Managed Care – PPO | Admitting: Podiatry

## 2020-09-11 ENCOUNTER — Ambulatory Visit (INDEPENDENT_AMBULATORY_CARE_PROVIDER_SITE_OTHER): Payer: BC Managed Care – PPO

## 2020-09-11 ENCOUNTER — Other Ambulatory Visit: Payer: Self-pay

## 2020-09-11 DIAGNOSIS — M2042 Other hammer toe(s) (acquired), left foot: Secondary | ICD-10-CM

## 2020-09-11 MED ORDER — OXYCODONE-ACETAMINOPHEN 10-325 MG PO TABS: 1.0000 | ORAL_TABLET | ORAL | 0 refills | Status: DC | PRN

## 2020-09-11 MED ORDER — DOXYCYCLINE HYCLATE 100 MG PO TABS: 100.0000 mg | ORAL_TABLET | Freq: Two times a day (BID) | ORAL | 0 refills | Status: DC

## 2020-09-11 NOTE — Progress Notes (Signed)
Subjective:   Patient ID: Chad Morris, male   DOB: 44 y.o.   MRN: [id number]   HPI Patient states overall he is doing pretty well but has had a lot of pain and currently he is using a walker for his feet   ROS      Objective:  Physical Exam  Neurovascular status intact negative Denna Haggard' sign noted syndactylization partial fourth interspace bilateral which appears to be healing well wound edges well coapted no drainage with slight redness of the forefoot left this not painful but it is localized to the third and fourth metatarsal areas.  The plantar incision left is healing well with wound edges well coapted     Assessment:  Overall doing well even though I think he has had more discomfort than I was anticipating even though it seems to feel better with the dressing being taken off     Plan:  Reviewed condition reapplied sterile dressing and advised on continued elevation and gradual return to weightbearing.  I did write him for a few pain pills to get him through the next 4 to 5 days and I also is a precautionary measure due to some redness placed him on doxycycline twice daily and gave strict instructions of any other conditions were to occur for him to contact us immediately.  Patient will be seen back to recheck  X-rays indicate satisfactory resection of bone fifth digit left had a proximal phalanx

## 2020-09-25 ENCOUNTER — Encounter: Payer: BC Managed Care – PPO | Admitting: Podiatry

## 2020-09-30 ENCOUNTER — Ambulatory Visit (INDEPENDENT_AMBULATORY_CARE_PROVIDER_SITE_OTHER): Payer: BC Managed Care – PPO | Admitting: Podiatrist

## 2020-09-30 ENCOUNTER — Other Ambulatory Visit: Payer: Self-pay

## 2020-09-30 DIAGNOSIS — L989 Disorder of the skin and subcutaneous tissue, unspecified: Secondary | ICD-10-CM

## 2020-09-30 DIAGNOSIS — M2042 Other hammer toe(s) (acquired), left foot: Secondary | ICD-10-CM

## 2020-09-30 MED ORDER — OXYCODONE-ACETAMINOPHEN 10-325 MG PO TABS: 1.0000 | ORAL_TABLET | Freq: Three times a day (TID) | ORAL | 0 refills | Status: AC | PRN

## 2020-09-30 MED ORDER — CLINDAMYCIN HCL 300 MG PO CAPS: 300.0000 mg | ORAL_CAPSULE | Freq: Three times a day (TID) | ORAL | 0 refills | Status: AC

## 2020-09-30 NOTE — Progress Notes (Signed)
     Chief Complaint  Patient presents with  . Routine Post Op    Pt stated that he is doing good the only concern is if the 5th toe is supposed to look that way. Has pain in the botttom of his foot. PT stated that the ABT made him really sick      Subjective: Patient presents today1 month (dos 09/03/2020)status post foot surgery of the both feet consisting of hammertoe repair bilateral fifth digits and excision of lesion left plantar foot. He was unable to take the antibiotic that was prescribed at the last visit due to it making him nauseaus (doxycycline).   Patient denies current nausea, vomiting, fevers, chills or night sweats.  Denies calf pain or tenderness to the operative side.  Objective:  Neurovascular status is intact with palpable pedal pulses DP and PT at 2+ out of 4 both feet.  Negative homans sign noted.  Neurological sensation is intact and unchanged as per prior to surgery.   Left fifth digit appears mildly red and swollen.  Incision site is healing but not completely coapted at todays visit.  Right fifth digit incision site is also coapting with sutures in place.  Plantar incision is well coapted with sutures in place.     Assessment: Status post bilateral foot surgery  Plan:  Removed sutures on the plantar incision of the left foot.  Recommended waiting another week to remove sutures on the fifth digits bilaterally and I applied iodosorb and a dressing and instructed him to keep the areas dry.  Also recommended an antibiotic and called in clindamycin.  He is to call if he is unable to tolerate the medication.  He will be seen back for suture removal and post op check and will call if concerns arise.

## 2020-10-10 ENCOUNTER — Encounter: Payer: Self-pay | Admitting: Podiatry

## 2020-10-10 ENCOUNTER — Ambulatory Visit (INDEPENDENT_AMBULATORY_CARE_PROVIDER_SITE_OTHER): Payer: BC Managed Care – PPO | Admitting: Podiatry

## 2020-10-10 ENCOUNTER — Ambulatory Visit (INDEPENDENT_AMBULATORY_CARE_PROVIDER_SITE_OTHER): Payer: BC Managed Care – PPO

## 2020-10-10 ENCOUNTER — Other Ambulatory Visit: Payer: Self-pay

## 2020-10-10 DIAGNOSIS — M2042 Other hammer toe(s) (acquired), left foot: Secondary | ICD-10-CM

## 2020-10-10 DIAGNOSIS — L989 Disorder of the skin and subcutaneous tissue, unspecified: Secondary | ICD-10-CM

## 2020-10-10 NOTE — Progress Notes (Signed)
Subjective:   Patient ID: Chad Morris, male   DOB: 44 y.o.   MRN: [id number]   HPI Patient presents for stitch removal bilateral states overall doing well   ROS      Objective:  Physical Exam  Neurovascular status intact negative Denna Haggard' sign noted wound edges well coapted digit 5 bilateral interspace and plantar left foot     Assessment:  Doing well post surgery with stitches that need to be removed     Plan:  All stitches removed today wound edges are coapted well no drainage no breakage of tissue and no indications of infection.  At this point I am releasing patient and I reviewed x-rays and he will be seen back as needed  X-rays indicate that the bone is satisfactory resected good alignment noted no signs of pathology which I conveyed to patient

## 2020-10-25 ENCOUNTER — Ambulatory Visit: Payer: BC Managed Care – PPO | Admitting: Podiatry

## 2020-10-28 ENCOUNTER — Other Ambulatory Visit: Payer: Self-pay

## 2020-10-28 ENCOUNTER — Encounter: Payer: Self-pay | Admitting: Podiatry

## 2020-10-28 ENCOUNTER — Ambulatory Visit (INDEPENDENT_AMBULATORY_CARE_PROVIDER_SITE_OTHER): Payer: BC Managed Care – PPO | Admitting: Podiatry

## 2020-10-28 ENCOUNTER — Other Ambulatory Visit: Payer: Self-pay | Admitting: Physician Assistant

## 2020-10-28 DIAGNOSIS — M2042 Other hammer toe(s) (acquired), left foot: Secondary | ICD-10-CM

## 2020-11-03 NOTE — Progress Notes (Signed)
Subjective:   Patient ID: Chad Morris, male   DOB: 44 y.o.   MRN: [id number]   HPI Patient presents with caregiver concerned that he has a stitch left in his left foot   ROS      Objective:  Physical Exam  Neurovascular status intact negative Denna Haggard' sign noted wound edges if healed very well no erythema no edema and no pain with a small stitch left left     Assessment:  1 aberrant stitch left     Plan:  Sterile removal of stitch no drainage reappoint as needed

## 2020-12-31 ENCOUNTER — Ambulatory Visit: Payer: BC Managed Care – PPO | Admitting: Physician Assistant

## 2020-12-31 ENCOUNTER — Other Ambulatory Visit: Payer: Self-pay

## 2020-12-31 ENCOUNTER — Encounter: Payer: Self-pay | Admitting: Physician Assistant

## 2020-12-31 VITALS — BP 110/70 | HR 63 | Temp 98.0°F | Resp 16 | Ht 72.5 in | Wt 241.0 lb

## 2020-12-31 DIAGNOSIS — R0789 Other chest pain: Secondary | ICD-10-CM

## 2020-12-31 DIAGNOSIS — B372 Candidiasis of skin and nail: Secondary | ICD-10-CM

## 2020-12-31 LAB — COMPREHENSIVE METABOLIC PANEL
ALT: 14 U/L (ref 0–53)
AST: 16 U/L (ref 0–37)
Albumin: 4 g/dL (ref 3.5–5.2)
Alkaline Phosphatase: 60 U/L (ref 39–117)
BUN: 8 mg/dL (ref 6–23)
CO2: 28 mEq/L (ref 19–32)
Calcium: 9.2 mg/dL (ref 8.4–10.5)
Chloride: 107 mEq/L (ref 96–112)
Creatinine, Ser: 1.05 mg/dL (ref 0.40–1.50)
GFR: 87.8 mL/min (ref >60.00)
Glucose, Bld: 80 mg/dL (ref 70–99)
Potassium: 3.8 mEq/L (ref 3.5–5.1)
Sodium: 138 mEq/L (ref 135–145)
Total Bilirubin: 1 mg/dL (ref 0.2–1.2)
Total Protein: 7 g/dL (ref 6.0–8.3)

## 2020-12-31 LAB — TSH: TSH: 2.04 u[IU]/mL (ref 0.35–4.50)

## 2020-12-31 LAB — CBC WITH DIFFERENTIAL/PLATELET
Basophils Absolute: 0 10*3/uL (ref 0.0–0.1)
Basophils Relative: 0.8 % (ref 0.0–3.0)
Eosinophils Absolute: 0.1 10*3/uL (ref 0.0–0.7)
Eosinophils Relative: 3.4 % (ref 0.0–5.0)
HCT: 40.8 % (ref 39.0–52.0)
Hemoglobin: 13.8 g/dL (ref 13.0–17.0)
Lymphocytes Relative: 43.7 % (ref 12.0–46.0)
Lymphs Abs: 1.7 10*3/uL (ref 0.7–4.0)
MCHC: 33.8 g/dL (ref 30.0–36.0)
MCV: 89.8 fl (ref 78.0–100.0)
Monocytes Absolute: 0.4 10*3/uL (ref 0.1–1.0)
Monocytes Relative: 9.2 % (ref 3.0–12.0)
Neutro Abs: 1.6 10*3/uL (ref 1.4–7.7)
Neutrophils Relative %: 42.9 % — ABNORMAL LOW (ref 43.0–77.0)
Platelets: 189 10*3/uL (ref 150.0–400.0)
RBC: 4.54 Mil/uL (ref 4.22–5.81)
RDW: 13.7 % (ref 11.5–15.5)
WBC: 3.8 10*3/uL — ABNORMAL LOW (ref 4.0–10.5)

## 2020-12-31 MED ORDER — CLOTRIMAZOLE-BETAMETHASONE 1-0.05 % EX CREA: 1.0000 "application " | TOPICAL_CREAM | Freq: Two times a day (BID) | CUTANEOUS | 0 refills | Status: DC

## 2020-12-31 NOTE — Progress Notes (Signed)
Patient presents to clinic today c/o intermittent left-sided chest pain occurring for the past month.  States pain comes and goes.  Can happen at rest or with exertion.  Notes the areas of left lower rib.  Sometimes feels like a fluttering sensation or that his heart is racing.  Denies any shortness of breath, lightheadedness or dizziness during these episodes.  Denies any back pain.  Patient with history of GERD, taking medication most days as directed.  Continues to smoke marijuana.  Denies any increase in heartburn symptoms.  Patient also notes extreme dryness and cracking of his left hand.  Has applied baby oil multiple OTC lotions without any improvement.  Hand is sometimes pruritic.  Past Medical History:  Diagnosis Date  . Depression   . Diabetes (HCC)    diet controlled  . GERD (gastroesophageal reflux disease)   . Hiatal hernia     Current Outpatient Medications on File Prior to Visit  Medication Sig Dispense Refill  . pantoprazole (PROTONIX) 40 MG tablet TAKE 1 TABLET BY MOUTH EVERY DAY 30 tablet 3   No current facility-administered medications on file prior to visit.    Allergies  Allergen Reactions  . Penicillins     Diagnosed as a child, does not recall Sx    Family History  Problem Relation Age of Onset  . Leukemia Mother   . Breast cancer Sister        80  . Colon cancer Neg Hx   . Esophageal cancer Neg Hx   . Rectal cancer Neg Hx   . Stomach cancer Neg Hx     Social History   Socioeconomic History  . Marital status: Married    Spouse name: Not on file  . Number of children: 3  . Years of education: Not on file  . Highest education level: Not on file  Occupational History  . Not on file  Tobacco Use  . Smoking status: Former Smoker    Packs/day: 0.50    Years: 25.00    Pack years: 12.50  . Smokeless tobacco: Never Used  . Tobacco comment: quit 07/23/2020  Vaping Use  . Vaping Use: Never used  Substance and Sexual Activity  . Alcohol use: Yes     Comment: social  . Drug use: Yes    Types: Marijuana    Comment: every day, last used 8/26 pm  . Sexual activity: Yes  Other Topics Concern  . Not on file  Social History Narrative   ** Merged History Encounter **       Social Determinants of Health   Financial Resource Strain: Not on file  Food Insecurity: Not on file  Transportation Needs: Not on file  Physical Activity: Not on file  Stress: Not on file  Social Connections: Not on file   Review of Systems - See HPI.  All other ROS are negative.  BP 110/70   Pulse 63   Temp 98 F (36.7 C) (Temporal)   Resp 16   Ht 6' 0.5" (1.842 m)   Wt 241 lb (109.3 kg)   SpO2 99%   BMI 32.24 kg/m   Physical Exam Vitals reviewed.  Constitutional:      Appearance: He is well-developed.  HENT:     Head: Normocephalic and atraumatic.  Eyes:     Extraocular Movements: Extraocular movements intact.     Pupils: Pupils are equal, round, and reactive to light.  Neck:     Thyroid: No thyromegaly.  Cardiovascular:  Rate and Rhythm: Normal rate and regular rhythm.     Heart sounds: Normal heart sounds.  Pulmonary:     Effort: Pulmonary effort is normal.     Breath sounds: Normal breath sounds.  Chest:     Chest wall: No mass, tenderness or edema.  Abdominal:     General: Bowel sounds are normal. There is no abdominal bruit.     Palpations: Abdomen is soft.     Tenderness: There is no abdominal tenderness.  Musculoskeletal:     Cervical back: Neck supple.  Neurological:     Mental Status: He is alert.    Assessment/Plan: 1. Atypical chest pain Asymptomatic at present.  Comes and goes and can happen at rest.  Question if he is getting episodes of more intense reflux with some mild gastritis.  Encouraged him to cease use of marijuana.  Avoid NSAIDs and alcohol.  Resume Protonix.  EKG today reveals sinus bradycardia at a rate of 54 bpm.  No acute endings..  Given occasional fluttering sensation, will have him set up with  cardiology for further evaluation just to be cautious.  Labs today as noted below.  Strict ER precautions discussed with patient. - EKG 12-Lead - CBC with Differential/Platelet - Comprehensive metabolic panel - TSH  2. Yeast infection of the skin Supportive measures and moisturizing regimen discussed.  Rx Lotrisone.  Follow-up if not resolving.   This visit occurred during the SARS-CoV-2 public health emergency.  Safety protocols were in place, including screening questions prior to the visit, additional usage of staff PPE, and extensive cleaning of exam room while observing appropriate contact time as indicated for disinfecting solutions.     Piedad Climes, PA-C

## 2020-12-31 NOTE — Patient Instructions (Signed)
Please go to the lab today for blood work.  I will call you with your results. We will alter treatment regimen(s) if indicated by your results.   Cut down on the marijuana use as this is likely worsening inflammation in the stomach and esophagus. Continue your Protonix daily. Consider adding on a nightly famotidine (pepcid) OTC.   I am setting you up with Cardiology for further workup to be cautious.  If you note any recurrence of symptoms that are continuing, any shortness of breath or racing heart with this, you need to be evaluated at the nearest ER.

## 2021-01-01 ENCOUNTER — Emergency Department (HOSPITAL_COMMUNITY): Payer: BC Managed Care – PPO

## 2021-01-01 ENCOUNTER — Ambulatory Visit (INDEPENDENT_AMBULATORY_CARE_PROVIDER_SITE_OTHER): Payer: BC Managed Care – PPO | Admitting: Podiatry

## 2021-01-01 ENCOUNTER — Encounter (HOSPITAL_COMMUNITY): Payer: Self-pay

## 2021-01-01 ENCOUNTER — Other Ambulatory Visit: Payer: Self-pay | Admitting: Emergency Medicine

## 2021-01-01 ENCOUNTER — Other Ambulatory Visit: Payer: Self-pay

## 2021-01-01 ENCOUNTER — Observation Stay (HOSPITAL_COMMUNITY)
Admission: EM | Admit: 2021-01-01 | Discharge: 2021-01-04 | Disposition: A | Discharge status: Home / Self Care | Payer: BC Managed Care – PPO | Attending: Surgery | Admitting: Surgery

## 2021-01-01 ENCOUNTER — Encounter: Payer: Self-pay | Admitting: Podiatry

## 2021-01-01 DIAGNOSIS — K449 Diaphragmatic hernia without obstruction or gangrene: Secondary | ICD-10-CM | POA: Diagnosis not present

## 2021-01-01 DIAGNOSIS — K219 Gastro-esophageal reflux disease without esophagitis: Secondary | ICD-10-CM | POA: Diagnosis present

## 2021-01-01 DIAGNOSIS — B070 Plantar wart: Secondary | ICD-10-CM

## 2021-01-01 DIAGNOSIS — Z87891 Personal history of nicotine dependence: Secondary | ICD-10-CM | POA: Insufficient documentation

## 2021-01-01 DIAGNOSIS — Z79899 Other long term (current) drug therapy: Secondary | ICD-10-CM | POA: Diagnosis not present

## 2021-01-01 DIAGNOSIS — R001 Bradycardia, unspecified: Secondary | ICD-10-CM | POA: Diagnosis not present

## 2021-01-01 DIAGNOSIS — F32A Depression, unspecified: Secondary | ICD-10-CM | POA: Diagnosis not present

## 2021-01-01 DIAGNOSIS — U071 COVID-19: Secondary | ICD-10-CM | POA: Insufficient documentation

## 2021-01-01 DIAGNOSIS — Z803 Family history of malignant neoplasm of breast: Secondary | ICD-10-CM

## 2021-01-01 DIAGNOSIS — Z806 Family history of leukemia: Secondary | ICD-10-CM

## 2021-01-01 DIAGNOSIS — R1031 Right lower quadrant pain: Secondary | ICD-10-CM | POA: Diagnosis not present

## 2021-01-01 DIAGNOSIS — K3580 Unspecified acute appendicitis: Secondary | ICD-10-CM | POA: Diagnosis not present

## 2021-01-01 DIAGNOSIS — E119 Type 2 diabetes mellitus without complications: Secondary | ICD-10-CM | POA: Insufficient documentation

## 2021-01-01 DIAGNOSIS — Z880 Allergy status to penicillin: Secondary | ICD-10-CM | POA: Diagnosis not present

## 2021-01-01 DIAGNOSIS — F1290 Cannabis use, unspecified, uncomplicated: Secondary | ICD-10-CM | POA: Diagnosis present

## 2021-01-01 DIAGNOSIS — R0789 Other chest pain: Secondary | ICD-10-CM

## 2021-01-01 LAB — CBC
HCT: 44.1 % (ref 39.0–52.0)
Hemoglobin: 14.3 g/dL (ref 13.0–17.0)
MCH: 30 pg (ref 26.0–34.0)
MCHC: 32.4 g/dL (ref 30.0–36.0)
MCV: 92.5 fL (ref 80.0–100.0)
Platelets: 193 10*3/uL (ref 150–400)
RBC: 4.77 MIL/uL (ref 4.22–5.81)
RDW: 13.5 % (ref 11.5–15.5)
WBC: 9.6 10*3/uL (ref 4.0–10.5)
nRBC: 0 % (ref 0.0–0.2)

## 2021-01-01 LAB — COMPREHENSIVE METABOLIC PANEL
ALT: 16 U/L (ref 0–44)
AST: 17 U/L (ref 15–41)
Albumin: 4.4 g/dL (ref 3.5–5.0)
Alkaline Phosphatase: 66 U/L (ref 38–126)
Anion gap: 8 (ref 5–15)
BUN: 9 mg/dL (ref 6–20)
CO2: 26 mmol/L (ref 22–32)
Calcium: 9.2 mg/dL (ref 8.9–10.3)
Chloride: 105 mmol/L (ref 98–111)
Creatinine, Ser: 1.17 mg/dL (ref 0.61–1.24)
GFR, Estimated: >60 mL/min (ref >60)
Glucose, Bld: 88 mg/dL (ref 70–99)
Potassium: 3.9 mmol/L (ref 3.5–5.1)
Sodium: 139 mmol/L (ref 135–145)
Total Bilirubin: 1.5 mg/dL — ABNORMAL HIGH (ref 0.3–1.2)
Total Protein: 7.9 g/dL (ref 6.5–8.1)

## 2021-01-01 LAB — URINALYSIS, ROUTINE W REFLEX MICROSCOPIC
Bilirubin Urine: NEGATIVE
Glucose, UA: NEGATIVE mg/dL
Hgb urine dipstick: NEGATIVE
Ketones, ur: NEGATIVE mg/dL
Leukocytes,Ua: NEGATIVE
Nitrite: NEGATIVE
Protein, ur: NEGATIVE mg/dL
Specific Gravity, Urine: 1.019 (ref 1.005–1.030)
pH: 5 (ref 5.0–8.0)

## 2021-01-01 LAB — LIPASE, BLOOD: Lipase: 24 U/L (ref 11–51)

## 2021-01-01 MED ORDER — SODIUM CHLORIDE 0.9 % IV BOLUS
1000.0000 mL | Freq: Once | INTRAVENOUS | Status: AC
  Administered 2021-01-01: 1000 mL via INTRAVENOUS

## 2021-01-01 MED ORDER — MORPHINE SULFATE (PF) 4 MG/ML IV SOLN: 4.0000 mg | INTRAVENOUS | Status: DC | PRN

## 2021-01-01 MED ORDER — SODIUM CHLORIDE 0.9 % IV SOLN: Freq: Once | INTRAVENOUS | Status: AC

## 2021-01-01 MED ORDER — IOHEXOL 300 MG/ML  SOLN
100.0000 mL | Freq: Once | INTRAMUSCULAR | Status: AC | PRN
  Administered 2021-01-01: 100 mL via INTRAVENOUS

## 2021-01-01 MED ORDER — SODIUM CHLORIDE 0.9 % IV SOLN
2.0000 g | Freq: Once | INTRAVENOUS | Status: AC
  Administered 2021-01-01: 2 g via INTRAVENOUS
  Filled 2021-01-01: qty 20

## 2021-01-01 MED ORDER — METRONIDAZOLE IN NACL 5-0.79 MG/ML-% IV SOLN
500.0000 mg | Freq: Once | INTRAVENOUS | Status: AC
  Administered 2021-01-02: 500 mg via INTRAVENOUS
  Filled 2021-01-01: qty 100

## 2021-01-01 MED ORDER — KETOROLAC TROMETHAMINE 30 MG/ML IJ SOLN
30.0000 mg | Freq: Once | INTRAMUSCULAR | Status: AC
  Administered 2021-01-01: 30 mg via INTRAVENOUS
  Filled 2021-01-01: qty 1

## 2021-01-01 NOTE — ED Provider Notes (Signed)
[facility]-EMERGENCY DEPT Provider Note   CSN: [id number] Arrival date & time: 01/01/21  2019     History Chief Complaint  Patient presents with  . Abdominal Pain    Chad Morris is a 45 y.o. male.  He has no significant past medical history.  Complaining of waking up with some right-sided abdominal pain this morning that is gradually gotten worse as the days gone on.  Worse with movement twisting and turning.  Not associated with any nausea vomiting.  1 loose bowel movement.  No fevers or chills.  No urinary symptoms.  No prior abdominal surgery.  The history is provided by the patient.  Abdominal Pain Pain location:  RLQ Pain quality: aching   Pain radiates to:  Does not radiate Pain severity:  Severe Onset quality:  Gradual Duration:  12 hours Timing:  Constant Progression:  Worsening Chronicity:  New Context: not trauma   Relieved by:  Nothing Worsened by:  Movement Ineffective treatments:  Position changes Associated symptoms: diarrhea   Associated symptoms: no anorexia, no chest pain, no chills, no constipation, no cough, no dysuria, no fever, no hematemesis, no hematochezia, no hematuria, no melena, no nausea, no shortness of breath, no sore throat and no vomiting        Past Medical History:  Diagnosis Date  . Depression   . Diabetes (HCC)    diet controlled  . GERD (gastroesophageal reflux disease)   . Hiatal hernia     Patient Active Problem List   Diagnosis Date Noted  . Gastroesophageal reflux disease with esophagitis without hemorrhage 08/27/2020  . Acute esophagitis 08/27/2020  . Prediabetes 01/31/2020    Past Surgical History:  Procedure Laterality Date  . FOOT SURGERY Right    right 5th digit  . UPPER GASTROINTESTINAL ENDOSCOPY         Family History  Problem Relation Age of Onset  . Leukemia Mother   . Breast cancer Sister        74  . Colon cancer Neg Hx   . Esophageal cancer Neg Hx   . Rectal cancer Neg  Hx   . Stomach cancer Neg Hx     Social History   Tobacco Use  . Smoking status: Former Smoker    Packs/day: 0.50    Years: 25.00    Pack years: 12.50  . Smokeless tobacco: Never Used  . Tobacco comment: quit 07/23/2020  Vaping Use  . Vaping Use: Never used  Substance Use Topics  . Alcohol use: Yes    Comment: social  . Drug use: Yes    Types: Marijuana    Comment: every day, last used 8/26 pm    Home Medications Prior to Admission medications   Medication Sig Start Date End Date Taking? Authorizing Provider  clotrimazole-betamethasone (LOTRISONE) cream Apply 1 application topically 2 (two) times daily. 12/31/20   Waldon Merl, PA-C  pantoprazole (PROTONIX) 40 MG tablet TAKE 1 TABLET BY MOUTH EVERY DAY 10/28/20   Tressia Danas, MD    Allergies    Penicillins  Review of Systems   Review of Systems  Constitutional: Negative for chills and fever.  HENT: Negative for sore throat.   Eyes: Negative for visual disturbance.  Respiratory: Negative for cough and shortness of breath.   Cardiovascular: Negative for chest pain.  Gastrointestinal: Positive for abdominal pain and diarrhea. Negative for anorexia, constipation, hematemesis, hematochezia, melena, nausea and vomiting.  Genitourinary: Negative for dysuria and hematuria.  Musculoskeletal: Negative for back  pain.  Skin: Negative for rash.  Neurological: Negative for headaches.    Physical Exam Updated Vital Signs BP (!) 154/88 (BP Location: Left Arm)   Pulse 70   Temp 98.3 F (36.8 C) (Oral)   Resp 19   Ht 6\' 1"  (1.854 m)   Wt 109 kg   SpO2 98%   BMI 31.70 kg/m   Physical Exam Vitals and nursing note reviewed.  Constitutional:      Appearance: Normal appearance. He is well-developed and well-nourished.  HENT:     Head: Normocephalic and atraumatic.  Eyes:     Conjunctiva/sclera: Conjunctivae normal.  Cardiovascular:     Rate and Rhythm: Normal rate and regular rhythm.     Heart sounds: No murmur  heard.   Pulmonary:     Effort: Pulmonary effort is normal. No respiratory distress.     Breath sounds: Normal breath sounds.  Abdominal:     General: Abdomen is flat.     Palpations: Abdomen is soft.     Tenderness: There is abdominal tenderness in the right lower quadrant. There is no guarding or rebound.  Musculoskeletal:        General: No deformity, signs of injury or edema. Normal range of motion.     Cervical back: Neck supple.  Skin:    General: Skin is warm and dry.     Capillary Refill: Capillary refill takes less than 2 seconds.  Neurological:     General: No focal deficit present.     Mental Status: He is alert.  Psychiatric:        Mood and Affect: Mood and affect normal.     ED Results / Procedures / Treatments   Labs (all labs ordered are listed, but only abnormal results are displayed) Labs Reviewed  SARS CORONAVIRUS 2 BY RT PCR (HOSPITAL ORDER, PERFORMED IN [facility]) - Abnormal; Notable for the following components:      Result Value   SARS Coronavirus 2 POSITIVE (*)    All other components within normal limits  COMPREHENSIVE METABOLIC PANEL - Abnormal; Notable for the following components:   Total Bilirubin 1.5 (*)    All other components within normal limits  LIPASE, BLOOD  CBC  URINALYSIS, ROUTINE W REFLEX MICROSCOPIC    EKG None  Radiology CT Abdomen Pelvis W Contrast  Result Date: 01/01/2021 CLINICAL DATA:  Right lower quadrant pain EXAM: CT ABDOMEN AND PELVIS WITH CONTRAST TECHNIQUE: Multidetector CT imaging of the abdomen and pelvis was performed using the standard protocol following bolus administration of intravenous contrast. CONTRAST:  OMNIPAQUE IOHEXOL 300 MG/ML  SOLN COMPARISON:  03/17/2020 FINDINGS: Lower chest: No acute abnormality. Hepatobiliary: Contracted gallbladder. No calcified stones. No biliary dilatation. No focal hepatic abnormality Pancreas: Unremarkable. No pancreatic ductal dilatation or surrounding  inflammatory changes. Spleen: Normal in size without focal abnormality. Adrenals/Urinary Tract: Adrenal glands are unremarkable. Kidneys are normal, without renal calculi, focal lesion, or hydronephrosis. Bladder is unremarkable. Stomach/Bowel: Stomach is within normal limits. Appendix is abnormal. Appendix dilated up to 9 mm. Moderate periappendiceal inflammation. No evidence of bowel wall thickening, distention, or inflammatory changes. Vascular/Lymphatic: No significant vascular findings are present. No enlarged abdominal or pelvic lymph nodes. Reproductive: Prostate is unremarkable. Other: Negative for free air or free fluid. Musculoskeletal: No acute or significant osseous findings. IMPRESSION: Findings consistent with acute appendicitis. No perforation or abscess. Appendix: Location: Right lower quadrant, appendix is retrocecal. Diameter: 9 mm Appendicolith: Negative Mucosal hyper-enhancement: Positive Extraluminal gas: Negative Periappendiceal collection:  Negative Electronically Signed   By: Jasmine Pang M.D.   On: 01/01/2021 23:23   DG CHEST PORT 1 VIEW  Result Date: 01/02/2021 CLINICAL DATA:  45 year old male with shortness of breath. Positive COVID-19. EXAM: PORTABLE CHEST 1 VIEW COMPARISON:  Chest CT 03/17/2020 and earlier. FINDINGS: Portable AP semi upright view at 0948 hours. Lung volumes and mediastinal contours remain normal. Visualized tracheal air column is within normal limits. Allowing for portable technique the lungs are clear. No pneumothorax or pleural effusion. No osseous abnormality identified. Paucity of bowel gas in the upper abdomen. IMPRESSION: Negative portable chest. Electronically Signed   By: Odessa Fleming M.D.   On: 01/02/2021 10:10    Procedures Procedures   Medications Ordered in ED Medications  enoxaparin (LOVENOX) injection 40 mg (has no administration in time range)  dextrose 5 % and 0.45 % NaCl with KCl 20 mEq/L infusion ( Intravenous New Bag/Given 01/02/21 1040)   cefTRIAXone (ROCEPHIN) 2 g in sodium chloride 0.9 % 100 mL IVPB (has no administration in time range)    And  metroNIDAZOLE (FLAGYL) IVPB 500 mg (500 mg Intravenous New Bag/Given 01/02/21 1005)  acetaminophen (TYLENOL) tablet 650 mg (has no administration in time range)    Or  acetaminophen (TYLENOL) suppository 650 mg (has no administration in time range)  oxyCODONE (Oxy IR/ROXICODONE) immediate release tablet 5-10 mg (has no administration in time range)  morphine 4 MG/ML injection 4 mg (has no administration in time range)  diphenhydrAMINE (BENADRYL) capsule 25 mg (has no administration in time range)    Or  diphenhydrAMINE (BENADRYL) injection 25 mg (has no administration in time range)  ondansetron (ZOFRAN-ODT) disintegrating tablet 4 mg (has no administration in time range)    Or  ondansetron (ZOFRAN) injection 4 mg (has no administration in time range)  metoprolol tartrate (LOPRESSOR) injection 5 mg (has no administration in time range)  pantoprazole (PROTONIX) EC tablet 40 mg (40 mg Oral Given 01/02/21 1006)  ketorolac (TORADOL) 30 MG/ML injection 30 mg (30 mg Intravenous Given 01/01/21 2247)  sodium chloride 0.9 % bolus 1,000 mL (0 mLs Intravenous Stopped 01/01/21 2349)  iohexol (OMNIPAQUE) 300 MG/ML solution 100 mL (100 mLs Intravenous Contrast Given 01/01/21 2308)  cefTRIAXone (ROCEPHIN) 2 g in sodium chloride 0.9 % 100 mL IVPB (0 g Intravenous Stopped 01/02/21 0032)    And  metroNIDAZOLE (FLAGYL) IVPB 500 mg (0 mg Intravenous Stopped 01/02/21 0241)  0.9 %  sodium chloride infusion ( Intravenous Stopped 01/02/21 1006)    ED Course  I have reviewed the triage vital signs and the nursing notes.  Pertinent labs & imaging results that were available during my care of the patient were reviewed by me and considered in my medical decision making (see chart for details).  Clinical Course as of 01/01/21 2351  Wed Jan 01, 2021  2330 Reviewed results with patient and he is in agreement with  plan to get evaluation with general surgery.  Discussed with Dr. Carolynne Edouard who recommended IV antibiotics and fluids and day team will see him in the morning. [MB]    Clinical Course User Index [MB] Terrilee Files, MD   MDM Rules/Calculators/A&P                         This patient complains of right lower quadrant abdominal pain; this involves an extensive number of treatment Options and is a complaint that carries with it a high risk of complications and Morbidity. The differential includes  appendicitis, colitis, diverticulitis, constipation, UTI, musculoskeletal  I ordered, reviewed and interpreted labs, which included CBC with normal white count normal hemoglobin, chemistries and LFTs normal, urinalysis negative, Covid testing pending at time of disposition I ordered medication IV pain medicine, IV antibiotics and fluids I ordered imaging studies which included CT abdomen and pelvis with contrast and I independently    visualized and interpreted imaging which showed acute uncomplicated appendicitis Additional history obtained from patient's wife Previous records obtained and reviewed in epic, no recent admissions I consulted Dr. Carolynne Edouard for general surgery and discussed lab and imaging findings  Critical Interventions: None  After the interventions stated above, I reevaluated the patient and found patient still to be minimally symptomatic.  Reviewed general surgery recommendations with patient and he is agreeable to admission.  General surgery to evaluate the patient in the a.m. for final disposition.   Final Clinical Impression(s) / ED Diagnoses Final diagnoses:  Acute appendicitis, unspecified acute appendicitis type    Rx / DC Orders ED Discharge Orders    None       Terrilee Files, MD 01/02/21 1115

## 2021-01-01 NOTE — ED Triage Notes (Signed)
Pt reports RLQ abdominal pain since this morning.

## 2021-01-02 ENCOUNTER — Observation Stay (HOSPITAL_COMMUNITY): Payer: BC Managed Care – PPO

## 2021-01-02 DIAGNOSIS — U071 COVID-19: Secondary | ICD-10-CM | POA: Diagnosis not present

## 2021-01-02 DIAGNOSIS — K3580 Unspecified acute appendicitis: Secondary | ICD-10-CM | POA: Diagnosis present

## 2021-01-02 LAB — SARS CORONAVIRUS 2 BY RT PCR (HOSPITAL ORDER, PERFORMED IN ~~LOC~~ HOSPITAL LAB): SARS Coronavirus 2: POSITIVE — AB

## 2021-01-02 MED ORDER — SODIUM CHLORIDE 0.9 % IV SOLN
2.0000 g | INTRAVENOUS | Status: DC
  Administered 2021-01-02 – 2021-01-03 (×2): 2 g via INTRAVENOUS
  Filled 2021-01-02 (×2): qty 2

## 2021-01-02 MED ORDER — METRONIDAZOLE IN NACL 5-0.79 MG/ML-% IV SOLN
500.0000 mg | Freq: Three times a day (TID) | INTRAVENOUS | Status: DC
  Administered 2021-01-02 – 2021-01-04 (×6): 500 mg via INTRAVENOUS
  Filled 2021-01-02 (×6): qty 100

## 2021-01-02 MED ORDER — ONDANSETRON HCL 4 MG/2ML IJ SOLN: 4.0000 mg | Freq: Four times a day (QID) | INTRAMUSCULAR | Status: DC | PRN

## 2021-01-02 MED ORDER — ENOXAPARIN SODIUM 40 MG/0.4ML ~~LOC~~ SOLN
40.0000 mg | SUBCUTANEOUS | Status: DC
  Administered 2021-01-02 – 2021-01-03 (×2): 40 mg via SUBCUTANEOUS
  Filled 2021-01-02 (×2): qty 0.4

## 2021-01-02 MED ORDER — KCL IN DEXTROSE-NACL 20-5-0.45 MEQ/L-%-% IV SOLN
INTRAVENOUS | Status: DC
  Filled 2021-01-02 (×4): qty 1000

## 2021-01-02 MED ORDER — MORPHINE SULFATE (PF) 4 MG/ML IV SOLN
4.0000 mg | INTRAVENOUS | Status: DC | PRN
Start: 2021-01-02 — End: 2021-01-03

## 2021-01-02 MED ORDER — ONDANSETRON 4 MG PO TBDP: 4.0000 mg | ORAL_TABLET | Freq: Four times a day (QID) | ORAL | Status: DC | PRN

## 2021-01-02 MED ORDER — OXYCODONE HCL 5 MG PO TABS
5.0000 mg | ORAL_TABLET | ORAL | Status: DC | PRN
Start: 2021-01-02 — End: 2021-01-04

## 2021-01-02 MED ORDER — ACETAMINOPHEN 325 MG PO TABS: 650.0000 mg | ORAL_TABLET | Freq: Four times a day (QID) | ORAL | Status: DC | PRN

## 2021-01-02 MED ORDER — DIPHENHYDRAMINE HCL 25 MG PO CAPS
25.0000 mg | ORAL_CAPSULE | Freq: Four times a day (QID) | ORAL | Status: DC | PRN
Start: 2021-01-02 — End: 2021-01-04

## 2021-01-02 MED ORDER — ACETAMINOPHEN 650 MG RE SUPP: 650.0000 mg | Freq: Four times a day (QID) | RECTAL | Status: DC | PRN

## 2021-01-02 MED ORDER — PANTOPRAZOLE SODIUM 40 MG PO TBEC
40.0000 mg | DELAYED_RELEASE_TABLET | Freq: Every day | ORAL | Status: DC
  Administered 2021-01-02 – 2021-01-04 (×3): 40 mg via ORAL
  Filled 2021-01-02 (×3): qty 1

## 2021-01-02 MED ORDER — METOPROLOL TARTRATE 5 MG/5ML IV SOLN: 5.0000 mg | Freq: Four times a day (QID) | INTRAVENOUS | Status: DC | PRN

## 2021-01-02 MED ORDER — DIPHENHYDRAMINE HCL 50 MG/ML IJ SOLN: 25.0000 mg | Freq: Four times a day (QID) | INTRAMUSCULAR | Status: DC | PRN

## 2021-01-02 NOTE — H&P (Signed)
[facility] Admission Note  Chad Morris 03-Aug-1976  [id number].    Requesting MD: Charm Barges, MD  Chief Complaint/Reason for Consult: appendicitis   HPI:  [name] is a 45 y/o M who presented to the ED with a cc abdominal pain. Pain started yesterday morning, described as gradually worsening, constant, non-radiating, right lower quadrant pain that caused him to bend over in pain. His wife encouraged him to come to ED for evaluation. He reports one loose, non-bloody stool yesterday AM but denies fever, chills, chest pain, SOB, nausea, vomiting, dysuria, hematuria diarrhea, melena, or constipation. Denies a known history of COVID 59, states he took 3 covid tests last week that were negative (at the mall and at [facility]) - tested himself along with his wife because she has plans to to travel to [place] soon and had to test negative to travel. He denies COVID sxs including cough, SOB, fever. Reports a runny nose year round. Reports taking antacids daily and no other daily meds. Denies blood thinning medications. Smokes marijuana daily but denies tobacco or other drug use.  ROS: As above Review of Systems  All other systems reviewed and are negative.   Family History  Problem Relation Age of Onset  . Leukemia Mother   . Breast cancer Sister        62  . Colon cancer Neg Hx   . Esophageal cancer Neg Hx   . Rectal cancer Neg Hx   . Stomach cancer Neg Hx     Past Medical History:  Diagnosis Date  . Depression   . Diabetes (HCC)    diet controlled  . GERD (gastroesophageal reflux disease)   . Hiatal hernia     Past Surgical History:  Procedure Laterality Date  . FOOT SURGERY Right    right 5th digit  . UPPER GASTROINTESTINAL ENDOSCOPY      Social History:  reports that he has quit smoking. He has a 12.50 pack-year smoking history. He has never used smokeless tobacco. He reports current alcohol use. He reports current drug use. Drug: Marijuana.  Allergies:   Allergies  Allergen Reactions  . Penicillins     Diagnosed as a child, does not recall Sx    (Not in a hospital admission)   Blood pressure 116/62, pulse (!) 55, temperature 98.3 F (36.8 C), temperature source Oral, resp. rate 16, height 6\' 1"  (1.854 m), weight 109 kg, SpO2 99 %. Physical Exam: Constitutional: NAD; conversant; no deformities Eyes: Moist conjunctiva; no lid lag; anicteric; PERRL Neck: Trachea midline; no thyromegaly Lungs: Normal respiratory effort; no tactile fremitus CV: RRR; no palpable thrills; no pitting edema GI: Abd soft, TTP right flank/midabdomen with guarding, no peritonitis, no palpable hepatosplenomegaly MSK: Normal gait; no clubbing/cyanosis Psychiatric: Appropriate affect; alert and oriented x3 Lymphatic: No palpable cervical or axillary lymphadenopathy  Results for orders placed or performed during the hospital encounter of 01/01/21 (from the past 48 hour(s))  Lipase, blood     Status: None   Collection Time: 01/01/21  8:30 PM  Result Value Ref Range   Lipase 24 11 - 51 U/L    Comment: Performed at [facility], [address], [place], [place]  Comprehensive metabolic panel     Status: Abnormal   Collection Time: 01/01/21  8:30 PM  Result Value Ref Range   Sodium 139 135 - 145 mmol/L   Potassium 3.9 3.5 - 5.1 mmol/L   Chloride 105 98 - 111 mmol/L   CO2  26 22 - 32 mmol/L   Glucose, Bld 88 70 - 99 mg/dL    Comment: Glucose reference range applies only to samples taken after fasting for at least 8 hours.   BUN 9 6 - 20 mg/dL   Creatinine, Ser 0.09 0.61 - 1.24 mg/dL   Calcium 9.2 8.9 - 38.1 mg/dL   Total Protein 7.9 6.5 - 8.1 g/dL   Albumin 4.4 3.5 - 5.0 g/dL   AST 17 15 - 41 U/L   ALT 16 0 - 44 U/L   Alkaline Phosphatase 66 38 - 126 U/L   Total Bilirubin 1.5 (H) 0.3 - 1.2 mg/dL   GFR, Estimated >82 >99 mL/min    Comment: (NOTE) Calculated using the CKD-EPI Creatinine Equation (2021)    Anion gap 8 5 - 15     Comment: Performed at [facility], [address] [place], [place] [address]  CBC     Status: None   Collection Time: 01/01/21  8:30 PM  Result Value Ref Range   WBC 9.6 4.0 - 10.5 K/uL   RBC 4.77 4.22 - 5.81 MIL/uL   Hemoglobin 14.3 13.0 - 17.0 g/dL   HCT 67.8 93.8 - 10.1 %   MCV 92.5 80.0 - 100.0 fL   MCH 30.0 26.0 - 34.0 pg   MCHC 32.4 30.0 - 36.0 g/dL   RDW 75.1 02.5 - 85.2 %   Platelets 193 150 - 400 K/uL   nRBC 0.0 0.0 - 0.2 %    Comment: Performed at [facility], [address] [place], [place] [address]  Urinalysis, Routine w reflex microscopic     Status: None   Collection Time: 01/01/21 11:00 PM  Result Value Ref Range   Color, Urine YELLOW YELLOW   APPearance CLEAR CLEAR   Specific Gravity, Urine 1.019 1.005 - 1.030   pH 5.0 5.0 - 8.0   Glucose, UA NEGATIVE NEGATIVE mg/dL   Hgb urine dipstick NEGATIVE NEGATIVE   Bilirubin Urine NEGATIVE NEGATIVE   Ketones, ur NEGATIVE NEGATIVE mg/dL   Protein, ur NEGATIVE NEGATIVE mg/dL   Nitrite NEGATIVE NEGATIVE   Leukocytes,Ua NEGATIVE NEGATIVE    Comment: Performed at [facility], [address] [place], [place] [address]  SARS Coronavirus 2 by RT PCR (hospital order, performed in [facility] hospital lab) Nasopharyngeal Nasopharyngeal Swab     Status: Abnormal   Collection Time: 01/01/21 11:27 PM   Specimen: Nasopharyngeal Swab  Result Value Ref Range   SARS Coronavirus 2 POSITIVE (A) NEGATIVE    Comment: CRITICAL RESULT CALLED TO, READ BACK BY AND VERIFIED WITH: LYNCH J . 1.27.22 @ 0109 BY MECIAL J. (NOTE) SARS-CoV-2 target nucleic acids are DETECTED  SARS-CoV-2 RNA is generally detectable in upper respiratory specimens  during the acute phase of infection.  Positive results are indicative  of the presence of the identified virus, but do not rule out bacterial infection or co-infection with other pathogens not detected by the test.  Clinical correlation with  patient history and  other diagnostic information is necessary to determine patient infection status.  The expected result is negative.  Fact Sheet for Patients:   BoilerBrush.com.cy   Fact Sheet for Healthcare Providers:   https://pope.com/    This test is not yet approved or cleared by the [place] FDA and  has been authorized for detection and/or diagnosis of SARS-CoV-2 by FDA under an Emergency Use Authorization (EUA).  This EUA will remain in effect (mea ning this  test can be used) for the duration of  the COVID-19 declaration under Section 564(b)(1) of the Act, 21 U.S.C. section 360-bbb-3(b)(1), unless the authorization is terminated or revoked sooner.  Performed at [facility], [address], [place], [place] [address]    CT Abdomen Pelvis W Contrast  Result Date: 01/01/2021 CLINICAL DATA:  Right lower quadrant pain EXAM: CT ABDOMEN AND PELVIS WITH CONTRAST TECHNIQUE: Multidetector CT imaging of the abdomen and pelvis was performed using the standard protocol following bolus administration of intravenous contrast. CONTRAST:  OMNIPAQUE IOHEXOL 300 MG/ML  SOLN COMPARISON:  03/17/2020 FINDINGS: Lower chest: No acute abnormality. Hepatobiliary: Contracted gallbladder. No calcified stones. No biliary dilatation. No focal hepatic abnormality Pancreas: Unremarkable. No pancreatic ductal dilatation or surrounding inflammatory changes. Spleen: Normal in size without focal abnormality. Adrenals/Urinary Tract: Adrenal glands are unremarkable. Kidneys are normal, without renal calculi, focal lesion, or hydronephrosis. Bladder is unremarkable. Stomach/Bowel: Stomach is within normal limits. Appendix is abnormal. Appendix dilated up to 9 mm. Moderate periappendiceal inflammation. No evidence of bowel wall thickening, distention, or inflammatory changes. Vascular/Lymphatic: No significant vascular findings are present. No  enlarged abdominal or pelvic lymph nodes. Reproductive: Prostate is unremarkable. Other: Negative for free air or free fluid. Musculoskeletal: No acute or significant osseous findings. IMPRESSION: Findings consistent with acute appendicitis. No perforation or abscess. Appendix: Location: Right lower quadrant, appendix is retrocecal. Diameter: 9 mm Appendicolith: Negative Mucosal hyper-enhancement: Positive Extraluminal gas: Negative Periappendiceal collection: Negative Electronically Signed   By: Jasmine Pang M.D.   On: 01/01/2021 23:23    Assessment/Plan GERD Daily marijuana use  COVID 19- asymptomatic, contact precautions, will get CXR to further evaluate  Acute uncomplicated appendicitis  - CT abd 1/29 shows an inflamed, retrocecal appendix without appendicolith or evidence of perforation - afebrile, VSS, WBC 9, patient non-toxic appearing without peritonitis or clinical evidence of perforation  - admit for IV abx and observation; attempt medical management of appendicitis with abx given the appendicitis is uncomplication and the patient covid positive. If the patient shows clinical deterioration in the next 24-48 hours he may need appendectomy.  FEN: NPO, sips/chips  ID: Rocephin/Flagyl 1/27 >> VTE: SCD's, Lovenox Dispo: med-surg, bowel rest, IV abx    Adam Phenix, PA-C [facility] Please see Amion for pager number during day hours 7:00am-4:30pm 01/02/2021, 9:37 AM

## 2021-01-02 NOTE — Plan of Care (Signed)
  Problem: Education: Goal: Knowledge of risk factors and measures for prevention of condition will improve Outcome: Progressing   Problem: Coping: Goal: Psychosocial and spiritual needs will be supported Outcome: Progressing   Problem: Respiratory: Goal: Will maintain a patent airway Outcome: Progressing Goal: Complications related to the disease process, condition or treatment will be avoided or minimized Outcome: Progressing   

## 2021-01-02 NOTE — ED Notes (Signed)
Patient received dinner tray 

## 2021-01-03 DIAGNOSIS — Z803 Family history of malignant neoplasm of breast: Secondary | ICD-10-CM | POA: Diagnosis not present

## 2021-01-03 DIAGNOSIS — K219 Gastro-esophageal reflux disease without esophagitis: Secondary | ICD-10-CM | POA: Diagnosis present

## 2021-01-03 DIAGNOSIS — R001 Bradycardia, unspecified: Secondary | ICD-10-CM | POA: Diagnosis present

## 2021-01-03 DIAGNOSIS — Z87891 Personal history of nicotine dependence: Secondary | ICD-10-CM | POA: Diagnosis not present

## 2021-01-03 DIAGNOSIS — U071 COVID-19: Secondary | ICD-10-CM | POA: Diagnosis present

## 2021-01-03 DIAGNOSIS — E119 Type 2 diabetes mellitus without complications: Secondary | ICD-10-CM | POA: Diagnosis present

## 2021-01-03 DIAGNOSIS — Z79899 Other long term (current) drug therapy: Secondary | ICD-10-CM | POA: Diagnosis not present

## 2021-01-03 DIAGNOSIS — K449 Diaphragmatic hernia without obstruction or gangrene: Secondary | ICD-10-CM | POA: Diagnosis present

## 2021-01-03 DIAGNOSIS — K3580 Unspecified acute appendicitis: Secondary | ICD-10-CM | POA: Diagnosis present

## 2021-01-03 DIAGNOSIS — F1290 Cannabis use, unspecified, uncomplicated: Secondary | ICD-10-CM | POA: Diagnosis present

## 2021-01-03 DIAGNOSIS — Z806 Family history of leukemia: Secondary | ICD-10-CM | POA: Diagnosis not present

## 2021-01-03 DIAGNOSIS — Z880 Allergy status to penicillin: Secondary | ICD-10-CM | POA: Diagnosis not present

## 2021-01-03 DIAGNOSIS — F32A Depression, unspecified: Secondary | ICD-10-CM | POA: Diagnosis present

## 2021-01-03 LAB — COMPREHENSIVE METABOLIC PANEL
ALT: 13 U/L (ref 0–44)
AST: 13 U/L — ABNORMAL LOW (ref 15–41)
Albumin: 3.4 g/dL — ABNORMAL LOW (ref 3.5–5.0)
Alkaline Phosphatase: 37 U/L — ABNORMAL LOW (ref 38–126)
Anion gap: 7 (ref 5–15)
BUN: 5 mg/dL — ABNORMAL LOW (ref 6–20)
CO2: 24 mmol/L (ref 22–32)
Calcium: 8.7 mg/dL — ABNORMAL LOW (ref 8.9–10.3)
Chloride: 108 mmol/L (ref 98–111)
Creatinine, Ser: 1.03 mg/dL (ref 0.61–1.24)
GFR, Estimated: >60 mL/min (ref >60)
Glucose, Bld: 100 mg/dL — ABNORMAL HIGH (ref 70–99)
Potassium: 4 mmol/L (ref 3.5–5.1)
Sodium: 139 mmol/L (ref 135–145)
Total Bilirubin: 1.6 mg/dL — ABNORMAL HIGH (ref 0.3–1.2)
Total Protein: 6.4 g/dL — ABNORMAL LOW (ref 6.5–8.1)

## 2021-01-03 LAB — CBC
HCT: 42.3 % (ref 39.0–52.0)
Hemoglobin: 13.8 g/dL (ref 13.0–17.0)
MCH: 30.3 pg (ref 26.0–34.0)
MCHC: 32.6 g/dL (ref 30.0–36.0)
MCV: 93 fL (ref 80.0–100.0)
Platelets: 161 10*3/uL (ref 150–400)
RBC: 4.55 MIL/uL (ref 4.22–5.81)
RDW: 13.2 % (ref 11.5–15.5)
WBC: 4.6 10*3/uL (ref 4.0–10.5)
nRBC: 0 % (ref 0.0–0.2)

## 2021-01-03 MED ORDER — MORPHINE SULFATE (PF) 2 MG/ML IV SOLN
2.0000 mg | INTRAVENOUS | Status: DC | PRN
Start: 2021-01-03 — End: 2021-01-04

## 2021-01-03 NOTE — Discharge Instructions (Signed)
Symptomatic. - Stay home for at least 5 days from your positive test and isolate from others in your home. Wear a well-fitted mask if you must be around others in your home. - End isolation after 5 full days if you are fever-free for 24 hours (without the use of fever-reducing medication) and your symptoms are improving. - Take precautions until day 10: Wear a well-fitted mask for 10 full days any time you are around others inside your home or in public. Do not go to places where you are unable to wear a mask. Avoid travel. Avoid being around people who are at high risk  Asymptomatic. - End isolation after at least 5 full days after your positive test if you did NOT have symptoms  Exposed, unvaccinated. - Quarantine at home for at least 5 days. Wear a well-fitted mask if you must be around others in your home. - Get tested at least 5 days after exposure (even if asymptomatic). - Watch for symptoms for 10 days. Isolate and test if symptoms develop.  - Wear a well-fitted mask for 10 full days any time you are around others inside your home or in public. Do not go to places where you are unable to wear a mask. Avoid travel. Avoid being around people who are at high risk  Exposed, vaccinated or infected w/in past 90 days. - No quarantine unless you develop symptoms. - Get tested at least 5 days after exposure (even if asymptomatic). - Watch for symptoms for 10 days. Isolate and test if symptoms develop.  - Wear a well-fitted mask for 10 full days any time you are around others inside your home or in public. Do not go to places where you are unable to wear a mask. Avoid travel. Avoid being around people who are at high risk  Directions for you at home:  Wear a facemask You should wear a facemask that covers your nose and mouth when you are in the same room with other people and when you visit a healthcare provider. People who live with or visit you should also wear a facemask while they are in  the same room with you.  Separate yourself from other people in your home As much as possible, you should stay in a different room from other people in your home. Also, you should use a separate bathroom, if available.  Avoid sharing household items You should not share dishes, drinking glasses, cups, eating utensils, towels, bedding, or other items with other people in your home. After using these items, you should wash them thoroughly with soap and water.  Cover your coughs and sneezes Cover your mouth and nose with a tissue when you cough or sneeze, or you can cough or sneeze into your sleeve. Throw used tissues in a lined trash can, and immediately wash your hands with soap and water for at least 20 seconds or use an alcohol-based hand rub.  Wash your [employer] your hands often and thoroughly with soap and water for at least 20 seconds. You can use an alcohol-based hand sanitizer if soap and water are not available and if your hands are not visibly dirty. Avoid touching your eyes, nose, and mouth with unwashed hands.  Directions for those who live with, or provide care at home for you:  Limit the number of people who have contact with the patient  If possible, have only one caregiver for the patient.  Other household members should stay in another home or place  of residence. If this is not possible, they should stay  in another room, or be separated from the patient as much as possible. Use a separate bathroom, if available.  Restrict visitors who do not have an essential need to be in the home.  Ensure good ventilation Make sure that shared spaces in the home have good air flow, such as from an air conditioner or an opened window, weather permitting.  Wash your hands often  Wash your hands often and thoroughly with soap and water for at least 20 seconds. You can use an alcohol based hand sanitizer if soap and water are not available and if your hands are not visibly  dirty.  Avoid touching your eyes, nose, and mouth with unwashed hands.  Use disposable paper towels to dry your hands. If not available, use dedicated cloth towels and replace them when they become wet.  Wear a facemask and gloves  Wear a disposable facemask at all times in the room and gloves when you touch or have contact with the patient's blood, body fluids, and/or secretions or excretions, such as sweat, saliva, sputum, nasal mucus, vomit, urine, or feces.  Ensure the mask fits over your nose and mouth tightly, and do not touch it during use.  Throw out disposable facemasks and gloves after using them. Do not reuse.  Wash your hands immediately after removing your facemask and gloves.  If your personal clothing becomes contaminated, carefully remove clothing and launder. Wash your hands after handling contaminated clothing.  Place all used disposable facemasks, gloves, and other waste in a lined container before disposing them with other household waste.  Remove gloves and wash your hands immediately after handling these items.  Do not share dishes, glasses, or other household items with the patient  Avoid sharing household items. You should not share dishes, drinking glasses, cups, eating utensils, towels, bedding, or other items with a patient who is confirmed to have, or being evaluated for, COVID-19 infection.  After the person uses these items, you should wash them thoroughly with soap and water.  Wash laundry thoroughly  Immediately remove and wash clothes or bedding that have blood, body fluids, and/or secretions or excretions, such as sweat, saliva, sputum, nasal mucus, vomit, urine, or feces, on them.  Wear gloves when handling laundry from the patient.  Read and follow directions on labels of laundry or clothing items and detergent. In general, wash and dry with the warmest temperatures recommended on the label.  Clean all areas the individual has used often  Clean  all touchable surfaces, such as counters, tabletops, doorknobs, bathroom fixtures, toilets, phones, keyboards, tablets, and bedside tables, every day. Also, clean any surfaces that may have blood, body fluids, and/or secretions or excretions on them.  Wear gloves when cleaning surfaces the patient has come in contact with.  Use a diluted bleach solution (e.g., dilute bleach with 1 part bleach and 10 parts water) or a household disinfectant with a label that says EPA-registered for coronaviruses. To make a bleach solution at home, add 1 tablespoon of bleach to 1 quart (4 cups) of water. For a larger supply, add  cup of bleach to 1 gallon (16 cups) of water.  Read labels of cleaning products and follow recommendations provided on product labels. Labels contain instructions for safe and effective use of the cleaning product including precautions you should take when applying the product, such as wearing gloves or eye protection and making sure you have good ventilation during use of  the product.  Remove gloves and wash hands immediately after cleaning.  Monitor yourself for signs and symptoms of illness Caregivers and household members are considered close contacts, should monitor their health, and will be asked to limit movement outside of the home to the extent possible. Follow the monitoring steps for close contacts listed on the symptom monitoring form.  If you have additional questions, contact your local health department or call the epidemiologist on call at [phone] (available 24/7). This guidance is subject to change. For the most up-to-date guidance from [facility], please refer to their website: [email or web address].hu     Appendicitis, Adult  Appendicitis is inflammation of the appendix. The appendix is a finger-shaped tube that is attached to the large intestine. If appendicitis is not treated, it can cause the appendix to tear  (rupture). A ruptured appendix can lead to a life-threatening infection. It can also cause a painful collection of pus (abscess) to form in the appendix. What are the causes? This condition may be caused by a blockage in the appendix that leads to infection. The blockage can be caused by:  A ball of stool (feces).  Enlarged lymph glands. In some cases, the cause may not be known. What increases the risk? Age is a risk factor. You are more likely to develop this condition if you are between 78 and 35 years of age. What are the signs or symptoms? Symptoms of this condition include:  Pain that starts around the belly button and moves toward the lower right part of the abdomen. The pain can become more severe as time passes. It gets worse with coughing or sudden movements.  Tenderness in the lower right abdomen.  Nausea.  Vomiting.  Loss of appetite.  Fever.  Difficulty passing stool (constipation).  Passing very loose stools (diarrhea).  Generally feeling unwell. How is this diagnosed? This condition may be diagnosed with:  A physical exam.  Blood tests.  Urine test. To confirm the diagnosis, an ultrasound, MRI, or CT scan may be done. How is this treated? Sometimes, this condition can be treated with antibiotic medicines alone. However, it is usually treated with both antibiotic medicines and surgery to remove the appendix (appendectomy). If only antibiotic medicine is given and the appendix is not removed, there is a chance that appendicitis could come back. There are two methods for doing an appendectomy:  Open appendectomy. In this surgery, the appendix is removed through a large incision that is made in the lower right abdomen. This procedure may be recommended if: ? You have major scarring from a previous surgery. ? You have a bleeding disorder. ? You are pregnant and are about to give birth. ? You have a condition that makes it hard to do surgery through small  incisions (laparoscopic procedure). This includes severe infection or a ruptured appendix.  Laparoscopic appendectomy. In this surgery, the appendix is removed through small incisions. This procedure usually causes less pain and fewer problems than an open appendectomy. It also has a shorter recovery time. If the appendix has ruptured and an abscess has formed:  A drain may be placed into the abscess to remove fluid.  Antibiotic medicines may be given through an IV.  The appendix may or may not need to be removed. Follow these instructions at home: If you had surgery, follow instructions from your health care provider about how to care for yourself at home and how to care for your incision. Medicines  Take over-the-counter and prescription medicines only as told  by your health care provider.  If you were prescribed an antibiotic medicine, take it as told by your health care provider. Do not stop taking the antibiotic even if you start to feel better. Eating and drinking  Follow instructions from your health care provider about eating restrictions. You may slowly resume a regular diet once your nausea or vomiting stops. General instructions  Do not use any products that contain nicotine or tobacco, such as cigarettes, e-cigarettes, and chewing tobacco. If you need help quitting, ask your health care provider.  Do not drive or use heavy machinery while taking prescription pain medicine.  Ask your health care provider if the medicine prescribed to you can cause constipation. You may need to take steps to prevent or treat constipation, such as: ? Drink enough fluid to keep your urine pale yellow. ? Take over-the-counter or prescription medicines. ? Eat foods that are high in fiber, such as beans, whole grains, and fresh fruits and vegetables. ? Limit foods that are high in fat and processed sugars, such as fried or sweet foods.  Keep all follow-up visits as told by your health care  provider. This is important. Contact a health care provider if:  There is pus, blood, or excessive drainage coming from your incision.  You have nausea or vomiting. Get help right away if you have:  Worsening abdominal pain.  A fever.  Chills.  Fatigue.  Muscle aches.  Shortness of breath. Summary  Appendicitis is inflammation of the appendix.  This condition may be caused by a blockage in the appendix that leads to infection.  This condition is usually treated with both antibiotic medicines and surgery to remove the appendix (appendectomy). This information is not intended to replace advice given to you by your health care provider. Make sure you discuss any questions you have with your health care provider. Document Revised: 02/19/2020 Document Reviewed: 05/11/2018 Elsevier Patient Education  2021 [employer].

## 2021-01-03 NOTE — Progress Notes (Signed)
Pt arrived to 1327 from 5th floor via bed alert and oriented without complaints, vss. Pt arrived with no ivf infusing, message sent to pharmacy for a new bag. Vital signs stable. Pt oriented to room.

## 2021-01-03 NOTE — Progress Notes (Signed)
Subjective: CC: Doing well. No abdominal pain at rest. This resolved this AM. Tolerating cld without increased abdominal pain, or n/v. He is passing flatus. No BM. Voiding. Mobilizing in room.   He denies prior covid vaccination. Afebrile overnight. No nasal congestion, rhinorrhea, sore throat, cough, chest pain, sob, or HA.   Objective: Vital signs in last 24 hours: Temp:  [98.3 F (36.8 C)-98.4 F (36.9 C)] 98.4 F (36.9 C) (01/28 0442) Pulse Rate:  [25-88] 45 (01/28 0442) Resp:  [12-20] 18 (01/28 0442) BP: (98-166)/(62-131) 133/86 (01/28 0442) SpO2:  [97 %-100 %] 100 % (01/28 0442) Last BM Date: 01/01/21  Intake/Output from previous day: 01/27 0701 - 01/28 0700 In: 2759.9 [I.V.:2562.1; IV Piggyback:197.8] Out: -  Intake/Output this shift: No intake/output data recorded.  PE: Gen:  Alert, NAD, pleasant HEENT: EOM's intact, pupils equal and round Card:  RRR, no M/G/R heard Pulm:  CTAB, no W/R/R, effort normal Abd: Soft, ND, mild RLQ tenderness, +BS Ext:  No LE edema  Psych: A&Ox4 Skin: no rashes noted, warm and dry   Lab Results:  Recent Labs    12/31/20 1138 01/01/21 2030  WBC 3.8* 9.6  HGB 13.8 14.3  HCT 40.8 44.1  PLT 189.0 193   BMET Recent Labs    01/01/21 2030 01/03/21 0410  NA 139 139  K 3.9 4.0  CL 105 108  CO2 26 24  GLUCOSE 88 100*  BUN 9 5*  CREATININE 1.17 1.03  CALCIUM 9.2 8.7*   PT/INR No results for input(s): LABPROT, INR in the last 72 hours. CMP     Component Value Date/Time   NA 139 01/03/2021 0410   NA 141 01/31/2020 1134   K 4.0 01/03/2021 0410   CL 108 01/03/2021 0410   CO2 24 01/03/2021 0410   GLUCOSE 100 (H) 01/03/2021 0410   BUN 5 (L) 01/03/2021 0410   BUN 10 01/31/2020 1134   CREATININE 1.03 01/03/2021 0410   CALCIUM 8.7 (L) 01/03/2021 0410   PROT 6.4 (L) 01/03/2021 0410   PROT 7.6 01/31/2020 1134   ALBUMIN 3.4 (L) 01/03/2021 0410   ALBUMIN 4.1 01/31/2020 1134   AST 13 (L) 01/03/2021 0410   ALT 13  01/03/2021 0410   ALKPHOS 37 (L) 01/03/2021 0410   BILITOT 1.6 (H) 01/03/2021 0410   BILITOT 0.8 01/31/2020 1134   GFRNONAA >60 01/03/2021 0410   GFRAA >60 03/17/2020 1926   Lipase     Component Value Date/Time   LIPASE 24 01/01/2021 2030       Studies/Results: CT Abdomen Pelvis W Contrast  Result Date: 01/01/2021 CLINICAL DATA:  Right lower quadrant pain EXAM: CT ABDOMEN AND PELVIS WITH CONTRAST TECHNIQUE: Multidetector CT imaging of the abdomen and pelvis was performed using the standard protocol following bolus administration of intravenous contrast. CONTRAST:  OMNIPAQUE IOHEXOL 300 MG/ML  SOLN COMPARISON:  03/17/2020 FINDINGS: Lower chest: No acute abnormality. Hepatobiliary: Contracted gallbladder. No calcified stones. No biliary dilatation. No focal hepatic abnormality Pancreas: Unremarkable. No pancreatic ductal dilatation or surrounding inflammatory changes. Spleen: Normal in size without focal abnormality. Adrenals/Urinary Tract: Adrenal glands are unremarkable. Kidneys are normal, without renal calculi, focal lesion, or hydronephrosis. Bladder is unremarkable. Stomach/Bowel: Stomach is within normal limits. Appendix is abnormal. Appendix dilated up to 9 mm. Moderate periappendiceal inflammation. No evidence of bowel wall thickening, distention, or inflammatory changes. Vascular/Lymphatic: No significant vascular findings are present. No enlarged abdominal or pelvic lymph nodes. Reproductive: Prostate is unremarkable. Other: Negative for  free air or free fluid. Musculoskeletal: No acute or significant osseous findings. IMPRESSION: Findings consistent with acute appendicitis. No perforation or abscess. Appendix: Location: Right lower quadrant, appendix is retrocecal. Diameter: 9 mm Appendicolith: Negative Mucosal hyper-enhancement: Positive Extraluminal gas: Negative Periappendiceal collection: Negative Electronically Signed   By: Jasmine Pang M.D.   On: 01/01/2021 23:23   DG  CHEST PORT 1 VIEW  Result Date: 01/02/2021 CLINICAL DATA:  45 year old male with shortness of breath. Positive COVID-19. EXAM: PORTABLE CHEST 1 VIEW COMPARISON:  Chest CT 03/17/2020 and earlier. FINDINGS: Portable AP semi upright view at 0948 hours. Lung volumes and mediastinal contours remain normal. Visualized tracheal air column is within normal limits. Allowing for portable technique the lungs are clear. No pneumothorax or pleural effusion. No osseous abnormality identified. Paucity of bowel gas in the upper abdomen. IMPRESSION: Negative portable chest. Electronically Signed   By: Odessa Fleming M.D.   On: 01/02/2021 10:10    Anti-infectives: Anti-infectives (From admission, onward)   Start     Dose/Rate Route Frequency Ordered Stop   01/02/21 2200  cefTRIAXone (ROCEPHIN) 2 g in sodium chloride 0.9 % 100 mL IVPB       "And" Linked Group Details   2 g 200 mL/hr over 30 Minutes Intravenous Every 24 hours 01/02/21 0936     01/02/21 1000  metroNIDAZOLE (FLAGYL) IVPB 500 mg       "And" Linked Group Details   500 mg 100 mL/hr over 60 Minutes Intravenous Every 8 hours 01/02/21 0936     01/01/21 2330  cefTRIAXone (ROCEPHIN) 2 g in sodium chloride 0.9 % 100 mL IVPB       "And" Linked Group Details   2 g 200 mL/hr over 30 Minutes Intravenous  Once 01/01/21 2327 01/02/21 0032   01/01/21 2330  metroNIDAZOLE (FLAGYL) IVPB 500 mg       "And" Linked Group Details   500 mg 100 mL/hr over 60 Minutes Intravenous  Once 01/01/21 2327 01/02/21 0241       Assessment/Plan GERD Daily marijuana use  Bradycardia - EKG 1/25. Tele  COVID 19- asymptomatic, contact precautions, CXR negative. If develops symptoms, may need a TRH consult   Acute uncomplicated appendicitis  - CT abd 1/29 shows an inflamed, retrocecal appendix without appendicolith or evidence of perforation - Afebrile, VSS, WBC 9 yesterday, AM labs pending. Patient non-toxic appearing without peritonitis or clinical evidence of perforation  -  Given the appendicitis is uncomplicated and the patient covid positive will do trial of non-op treatment with the increased risk of pulmonary complications for patient that undergo general anesthesia/surgery. If the patient shows clinical deterioration in the next 24-48 hours he may need appendectomy. - Cont IV abx - Check labs - Adv to FLD. PM check for adv to soft diet - Discussed with patient if he was to improve with conservative management we would schedule him a follow up to discuss possible interval appendectomy in a few weeks. He is interested in this  FEN: FLD. Dec IVF ID: Rocephin/Flagyl 1/27 >> VTE: SCD's, Lovenox   LOS: 0 days    Jacinto Halim , [facility] 01/03/2021, 8:45 AM Please see Amion for pager number during day hours 7:00am-4:30pm

## 2021-01-04 LAB — HEPATIC FUNCTION PANEL
ALT: 14 U/L (ref 0–44)
AST: 15 U/L (ref 15–41)
Albumin: 3.3 g/dL — ABNORMAL LOW (ref 3.5–5.0)
Alkaline Phosphatase: 34 U/L — ABNORMAL LOW (ref 38–126)
Bilirubin, Direct: 0.3 mg/dL — ABNORMAL HIGH (ref 0.0–0.2)
Indirect Bilirubin: 1 mg/dL — ABNORMAL HIGH (ref 0.3–0.9)
Total Bilirubin: 1.3 mg/dL — ABNORMAL HIGH (ref 0.3–1.2)
Total Protein: 6.5 g/dL (ref 6.5–8.1)

## 2021-01-04 LAB — CBC
HCT: 39.8 % (ref 39.0–52.0)
Hemoglobin: 13.4 g/dL (ref 13.0–17.0)
MCH: 30.4 pg (ref 26.0–34.0)
MCHC: 33.7 g/dL (ref 30.0–36.0)
MCV: 90.2 fL (ref 80.0–100.0)
Platelets: 176 10*3/uL (ref 150–400)
RBC: 4.41 MIL/uL (ref 4.22–5.81)
RDW: 13.2 % (ref 11.5–15.5)
WBC: 5.7 10*3/uL (ref 4.0–10.5)
nRBC: 0 % (ref 0.0–0.2)

## 2021-01-04 LAB — BASIC METABOLIC PANEL
Anion gap: 11 (ref 5–15)
BUN: <5 mg/dL — ABNORMAL LOW (ref 6–20)
CO2: 23 mmol/L (ref 22–32)
Calcium: 8.7 mg/dL — ABNORMAL LOW (ref 8.9–10.3)
Chloride: 105 mmol/L (ref 98–111)
Creatinine, Ser: 1.13 mg/dL (ref 0.61–1.24)
GFR, Estimated: >60 mL/min (ref >60)
Glucose, Bld: 76 mg/dL (ref 70–99)
Potassium: 3.5 mmol/L (ref 3.5–5.1)
Sodium: 139 mmol/L (ref 135–145)

## 2021-01-04 MED ORDER — OXYCODONE HCL 5 MG PO TABS: 5.0000 mg | ORAL_TABLET | Freq: Four times a day (QID) | ORAL | 0 refills | Status: DC | PRN

## 2021-01-04 MED ORDER — CIPROFLOXACIN HCL 500 MG PO TABS
500.0000 mg | ORAL_TABLET | Freq: Two times a day (BID) | ORAL | Status: DC
  Administered 2021-01-04: 500 mg via ORAL
  Filled 2021-01-04: qty 1

## 2021-01-04 MED ORDER — CIPROFLOXACIN HCL 500 MG PO TABS: 500.0000 mg | ORAL_TABLET | Freq: Two times a day (BID) | ORAL | 0 refills | Status: DC

## 2021-01-04 MED ORDER — METRONIDAZOLE 500 MG PO TABS: 500.0000 mg | ORAL_TABLET | Freq: Three times a day (TID) | ORAL | 0 refills | Status: DC

## 2021-01-04 MED ORDER — METRONIDAZOLE 500 MG PO TABS
500.0000 mg | ORAL_TABLET | Freq: Three times a day (TID) | ORAL | Status: DC
  Administered 2021-01-04: 500 mg via ORAL
  Filled 2021-01-04: qty 1

## 2021-01-04 NOTE — Discharge Summary (Signed)
Physician Discharge Summary  Patient ID: Chad Morris MRN: [id number] DOB/AGE: 43/17/1977 43 y.o.  Admit date: 01/01/2021 Discharge date: 01/04/2021  Admission Diagnoses:  Acute appendicitis    COVID positive  Discharge Diagnoses: same Active Problems:   Acute appendicitis   Discharged Condition: good  Hospital Course: patient presented 01/02/21 with acute appendicitis and a new diagnosis of COVID.  The decision was made to attempt non-operative management of his appendicitis.  His symptoms improved.  WBC normalized.  He remained afebrile.  Ready for discharge today on antibiotics with plans for interval appendectomy.   Treatments: antibiotics: ceftriaxone and metronidazole  Discharge Exam: Blood pressure 140/85, pulse 63, temperature 98.4 F (36.9 C), resp. rate 18, height 6\' 1"  (1.854 m), weight 109 kg, SpO2 99 %. Gen:  Alert, NAD, pleasant HEENT: EOM's intact, pupils equal and round Card:  RRR, no M/G/R heard Pulm:  CTAB, no W/R/R, effort normal Abd: Soft, ND, NT, +BS Ext:  No LE edema  Psych: A&Ox4 Skin: no rashes noted, warm and dry  Disposition: Discharge disposition: 01-Home or Self Care       Discharge Instructions    Call MD for:  persistant nausea and vomiting   Complete by: As directed    Call MD for:  redness, tenderness, or signs of infection (pain, swelling, redness, odor or green/yellow discharge around incision site)   Complete by: As directed    Call MD for:  severe uncontrolled pain   Complete by: As directed    Call MD for:  temperature >100.4   Complete by: As directed    Diet general   Complete by: As directed    Driving Restrictions   Complete by: As directed    Do not drive while taking pain medications   Increase activity slowly   Complete by: As directed    May shower / Bathe   Complete by: As directed      Allergies as of 01/04/2021      Reactions   Penicillins    Diagnosed as a child, does not recall Sx      Medication  List    TAKE these medications   ciprofloxacin 500 MG tablet Commonly known as: CIPRO Take 1 tablet (500 mg total) by mouth 2 (two) times daily.   clotrimazole-betamethasone cream Commonly known as: Lotrisone Apply 1 application topically 2 (two) times daily.   metroNIDAZOLE 500 MG tablet Commonly known as: FLAGYL Take 1 tablet (500 mg total) by mouth every 8 (eight) hours.   oxyCODONE 5 MG immediate release tablet Commonly known as: Oxy IR/ROXICODONE Take 1 tablet (5 mg total) by mouth every 6 (six) hours as needed for moderate pain.       Follow-up Information    01/06/2021, MD. Go on 01/27/2021.   Specialty: General Surgery Why: 3pm. Please arrive 30 minutes prior to your appointment for paperwork. Please bring a copy of your photo ID and insurance card.  Contact information: [address] [place] [phone]               Signed: [phone] 01/04/2021, 8:43 AM

## 2021-01-04 NOTE — Progress Notes (Signed)
Pt verbalizes understanding of all discharge instructions. All questions answered. Pt sent home with all belongings, and discharge instructions

## 2021-01-05 NOTE — Progress Notes (Signed)
Subjective:   Patient ID: Chad Morris, male   DOB: 45 y.o.   MRN: [id number]   HPI Patient presents overall has been doing well with surgery but is having pain with a small lesion on the outside of the left foot.  States it hurts to walk on   ROS      Objective:  Physical Exam  Neurovascular status found to be intact negative Denna Haggard' sign noted well-healed surgical site where previous surgery was performed with a plantar keratotic lesion left that upon debridement shows pinpoint bleeding pain to palpation     Assessment:  Verruca plantaris possible left or possible foreign body secondary to previous injury with small inclusion cyst     Plan:  H&P reviewed condition debrided the area completely and went ahead and applied chemical agent to create immune response with sterile dressing.  Gave instructions on padding and what to do if any blistering were to occur

## 2021-01-08 ENCOUNTER — Other Ambulatory Visit: Payer: Self-pay | Admitting: Gastroenterology

## 2021-01-12 NOTE — Progress Notes (Deleted)
Cardiology Office Note:    Date:  01/12/2021   ID:  Chad Morris, DOB 10/26/1976, MRN [id number]  PCP:  Noel Journey  [facility] Cardiologist:  No primary care provider on file.  [facility] Electrophysiologist:  None   Referring MD: Waldon Merl, PA-C    History of Present Illness:    Chad Morris is a 45 y.o. male with a hx of depression, DMII, and GERD who was referred by Marcelline Mates, PA-C for further evaluation of chest pain.  Patient recently admitted to [facility] from 01/01/21-01/04/21 for acute appendicitis and COVID managed conservatively with ABX with planned interval appendectomy in the future. Prior to his hospitalization, he saw his PCP where he complained on intermittent left sided chest pain that had been ongoing for the past month. Could occur with rest and exertion with assocaited palpitations. No lightheadedness or dizziness. Now referred to Cardiology for further management.  Past Medical History:  Diagnosis Date  . Depression   . Diabetes (HCC)    diet controlled  . GERD (gastroesophageal reflux disease)   . Hiatal hernia     Past Surgical History:  Procedure Laterality Date  . FOOT SURGERY Right    right 5th digit  . UPPER GASTROINTESTINAL ENDOSCOPY      Current Medications: No outpatient medications have been marked as taking for the 01/14/21 encounter (Appointment) with Meriam Sprague, MD.     Allergies:   Penicillins   Social History   Socioeconomic History  . Marital status: Married    Spouse name: Not on file  . Number of children: 3  . Years of education: Not on file  . Highest education level: Not on file  Occupational History  . Not on file  Tobacco Use  . Smoking status: Former Smoker    Packs/day: 0.50    Years: 25.00    Pack years: 12.50  . Smokeless tobacco: Never Used  . Tobacco comment: quit 07/23/2020  Vaping Use  . Vaping Use: Never used  Substance and Sexual Activity  . Alcohol use: Yes     Comment: social  . Drug use: Yes    Types: Marijuana    Comment: every day, last used 8/26 pm  . Sexual activity: Yes  Other Topics Concern  . Not on file  Social History Narrative   ** Merged History Encounter **       Social Determinants of Health   Financial Resource Strain: Not on file  Food Insecurity: Not on file  Transportation Needs: Not on file  Physical Activity: Not on file  Stress: Not on file  Social Connections: Not on file     Family History: The patient's ***family history includes Breast cancer in his sister; Leukemia in his mother. There is no history of Colon cancer, Esophageal cancer, Rectal cancer, or Stomach cancer.  ROS:   Please see the history of present illness.    *** All other systems reviewed and are negative.  EKGs/Labs/Other Studies Reviewed:    The following studies were reviewed today: ***  EKG:  EKG is *** ordered today.  The ekg ordered today demonstrates ***  Recent Labs: 12/31/2020: TSH 2.04 01/04/2021: ALT 14; BUN <5; Creatinine, Ser 1.13; Hemoglobin 13.4; Platelets 176; Potassium 3.5; Sodium 139  Recent Lipid Panel No results found for: CHOL, TRIG, HDL, CHOLHDL, VLDL, LDLCALC, LDLDIRECT   Risk Assessment/Calculations:   {Does this patient have ATRIAL FIBRILLATION?[phone]}   Physical Exam:    VS:  There  were no vitals taken for this visit.    Wt Readings from Last 3 Encounters:  01/01/21 240 lb 4.8 oz (109 kg)  12/31/20 241 lb (109.3 kg)  08/27/20 230 lb 4 oz (104.4 kg)     GEN: *** Well nourished, well developed in no acute distress HEENT: Normal NECK: No JVD; No carotid bruits LYMPHATICS: No lymphadenopathy CARDIAC: ***RRR, no murmurs, rubs, gallops RESPIRATORY:  Clear to auscultation without rales, wheezing or rhonchi  ABDOMEN: Soft, non-tender, non-distended MUSCULOSKELETAL:  No edema; No deformity  SKIN: Warm and dry NEUROLOGIC:  Alert and oriented x 3 PSYCHIATRIC:  Normal affect   ASSESSMENT:    No  diagnosis found. PLAN:    In order of problems listed above:  #Chest Pain: Atypical as can occur with rest and exertion.  -Check coronary CTA   {Are you ordering a CV Procedure (e.g. stress test, cath, DCCV, TEE, etc)?   Press F2        :[id number]}    Medication Adjustments/Labs and Tests Ordered: Current medicines are reviewed at length with the patient today.  Concerns regarding medicines are outlined above.  No orders of the defined types were placed in this encounter.  No orders of the defined types were placed in this encounter.   There are no Patient Instructions on file for this visit.   Signed, Meriam Sprague, MD  01/12/2021 9:35 AM    [facility]

## 2021-01-14 ENCOUNTER — Ambulatory Visit: Payer: BC Managed Care – PPO | Admitting: Cardiology

## 2021-01-24 NOTE — Progress Notes (Signed)
Cardiology Office Note:    Date:  01/27/2021   ID:  Chad Morris, DOB 01-Apr-1976, MRN [id number]  PCP:  [facility]  Cardiologist:  No primary care provider on file.  Advanced Practice Provider:  No care team member to display Electrophysiologist:  None    Referring MD: Waldon Merl, PA-C    History of Present Illness:    Chad Morris is a 45 y.o. male with a hx of depression, DMII, and GERD who was referred by Marcelline Mates, PA-C for further evaluation of chest pain.  Patient recently admitted to [facility] from 01/01/21-01/04/21 for acute appendicitis and COVID managed conservatively with ABX with planned interval appendectomy in the future. Prior to his hospitalization, he saw his PCP where he complained on intermittent left sided chest pain that had been ongoing for the past month. Could occur with rest and exertion with assocaited palpitations. No lightheadedness or dizziness. Now referred to Cardiology for further management.  Patient states that he occasionally has a fluttering in his chest that last a couple of seconds. Also has occasional throbbing in the chest that can come on separately from the fluttering. Occurs a couple times per month. Symptoms are non-exertional and non-positional. Cannot predict when they are going to occur. He is active and is able to exercise without limitations. Able to climb a flight of stairs, walk a mile, do burpees without issues. No known family history of heart disease. No lightheadedness, dizziness, or syncope. No LE edema. No orthopnea, PND.   Past Medical History:  Diagnosis Date  . Depression   . Diabetes (HCC)    diet controlled  . GERD (gastroesophageal reflux disease)   . Hiatal hernia     Past Surgical History:  Procedure Laterality Date  . FOOT SURGERY Right    right 5th digit  . UPPER GASTROINTESTINAL ENDOSCOPY      Current Medications: Current Meds  Medication  Sig  . ciprofloxacin (CIPRO) 500 MG tablet Take 1 tablet (500 mg total) by mouth 2 (two) times daily.  . clotrimazole-betamethasone (LOTRISONE) cream Apply 1 application topically 2 (two) times daily.  . metroNIDAZOLE (FLAGYL) 500 MG tablet Take 1 tablet (500 mg total) by mouth every 8 (eight) hours.  Marland Kitchen oxyCODONE (OXY IR/ROXICODONE) 5 MG immediate release tablet Take 1 tablet (5 mg total) by mouth every 6 (six) hours as needed for moderate pain.  . pantoprazole (PROTONIX) 40 MG tablet TAKE 1 TABLET BY MOUTH TWICE A DAY     Allergies:   Penicillins   Social History   Socioeconomic History  . Marital status: Married    Spouse name: Not on file  . Number of children: 3  . Years of education: Not on file  . Highest education level: Not on file  Occupational History  . Not on file  Tobacco Use  . Smoking status: Former Smoker    Packs/day: 0.50    Years: 25.00    Pack years: 12.50  . Smokeless tobacco: Never Used  . Tobacco comment: quit 07/23/2020  Vaping Use  . Vaping Use: Never used  Substance and Sexual Activity  . Alcohol use: Yes    Comment: social  . Drug use: Yes    Types: Marijuana    Comment: every day, last used 8/26 pm  . Sexual activity: Yes  Other Topics Concern  . Not on file  Social History Narrative   ** Merged History Encounter **  Social Determinants of Health   Financial Resource Strain: Not on file  Food Insecurity: Not on file  Transportation Needs: Not on file  Physical Activity: Not on file  Stress: Not on file  Social Connections: Not on file     Family History: The patient's family history includes Breast cancer in his sister; Leukemia in his mother. There is no history of Colon cancer, Esophageal cancer, Rectal cancer, or Stomach cancer.  ROS:   Please see the history of present illness.    Review of Systems  Constitutional: Negative for chills and fever.  HENT: Negative for nosebleeds.   Eyes: Negative for blurred vision and  redness.  Respiratory: Negative for shortness of breath.   Cardiovascular: Positive for chest pain and palpitations. Negative for orthopnea, claudication, leg swelling and PND.  Gastrointestinal: Negative for nausea and vomiting.  Genitourinary: Negative for dysuria and flank pain.  Musculoskeletal: Negative for falls and neck pain.  Neurological: Negative for dizziness and loss of consciousness.  Endo/Heme/Allergies: Negative for polydipsia.  Psychiatric/Behavioral: Negative for memory loss.    EKGs/Labs/Other Studies Reviewed:    The following studies were reviewed today:  ECG 12/31/20: Sinus bradycardia with HR 54. No ischemia or block.  CT abdomen/pelvis 01/01/21: FINDINGS: Lower chest: No acute abnormality.  Hepatobiliary: Contracted gallbladder. No calcified stones. No biliary dilatation. No focal hepatic abnormality  Pancreas: Unremarkable. No pancreatic ductal dilatation or surrounding inflammatory changes.  Spleen: Normal in size without focal abnormality.  Adrenals/Urinary Tract: Adrenal glands are unremarkable. Kidneys are normal, without renal calculi, focal lesion, or hydronephrosis. Bladder is unremarkable.  Stomach/Bowel: Stomach is within normal limits. Appendix is abnormal. Appendix dilated up to 9 mm. Moderate periappendiceal inflammation. No evidence of bowel wall thickening, distention, or inflammatory changes.  Vascular/Lymphatic: No significant vascular findings are present. No enlarged abdominal or pelvic lymph nodes.  Reproductive: Prostate is unremarkable.  Other: Negative for free air or free fluid.  Musculoskeletal: No acute or significant osseous findings.  IMPRESSION: Findings consistent with acute appendicitis. No perforation or abscess.  Appendix: Location: Right lower quadrant, appendix is retrocecal.  Diameter: 9 mm  Appendicolith: Negative  Mucosal hyper-enhancement: Positive  Extraluminal gas:  Negative  Periappendiceal collection: Negative  Recent Labs: 12/31/2020: TSH 2.04 01/04/2021: ALT 14; BUN <5; Creatinine, Ser 1.13; Hemoglobin 13.4; Platelets 176; Potassium 3.5; Sodium 139  Recent Lipid Panel No results found for: CHOL, TRIG, HDL, CHOLHDL, VLDL, LDLCALC, LDLDIRECT    Physical Exam:    VS:  BP 104/72   Pulse 68   Ht 6\' 1"  (1.854 m)   Wt 241 lb (109.3 kg)   SpO2 98%   BMI 31.80 kg/m     Wt Readings from Last 3 Encounters:  01/27/21 241 lb (109.3 kg)  01/01/21 240 lb 4.8 oz (109 kg)  12/31/20 241 lb (109.3 kg)     GEN:  Well nourished, well developed in no acute distress HEENT: Normal NECK: No JVD; No carotid bruits CARDIAC: RRR, no murmurs, rubs, gallops RESPIRATORY:  Clear to auscultation without rales, wheezing or rhonchi  ABDOMEN: Soft, non-tender, non-distended MUSCULOSKELETAL:  No edema; No deformity  SKIN: Warm and dry NEUROLOGIC:  Alert and oriented x 3 PSYCHIATRIC:  Normal affect   ASSESSMENT:    1. Palpitations   2. Atypical chest pain    PLAN:    In order of problems listed above:  #Palpitations: Patient with intermittent fluttering in his chest that has been ongoing for the past several years. Symptoms occur a couple times per month.  No associated chest pain, SOB, lightheadedness or dizziness. Each episode lasts a couple of seconds before resolving. Suspect PVCs, however, given that symptoms are bothersome, will check 30day monitor. -Check 30 day monitor; does not need to be completed prior to appendectomy -Maintain good hydration -Avoid excessive caffeine -Counseled that smoking marijuana may make symptoms worse  #Non-Cardiac Chest Pain: Patient with episode of chest throbbing that occurs a couple of times per month. Symptoms occur at rest and resolve within a couple of seconds without intervention. No exertional symptoms and patient is active without limitations. No personal or family history of CAD. Do not suspect cardiac etiology  of symptoms. Will risk stratify with lipid panel and continue to monitor. No further testing needed prior to appendectomy from a Cardiology stand-point. -Check lipid panel -Do not suspect cardiac etiology of symptoms and patient is able to perform >4METs without issues -No further testing needed prior to appendectomy     Medication Adjustments/Labs and Tests Ordered: Current medicines are reviewed at length with the patient today.  Concerns regarding medicines are outlined above.  Orders Placed This Encounter  Procedures  . Lipid panel  . Cardiac event monitor   No orders of the defined types were placed in this encounter.   Patient Instructions  Medication Instructions:  Your physician recommends that you continue on your current medications as directed. Please refer to the Current Medication list given to you today.  *If you need a refill on your cardiac medications before your next appointment, please call your pharmacy*   Lab Work: TODAY:  LIPID  If you have labs (blood work) drawn today and your tests are completely normal, you will receive your results only by: Marland Kitchen. MyChart Message (if you have MyChart) OR . A paper copy in the mail If you have any lab test that is abnormal or we need to change your treatment, we will call you to review the results.   Testing/Procedures: Your physician has recommended that you wear an event monitor. Event monitors are medical devices that record the heart's electrical activity. Doctors most often us these monitors to diagnose arrhythmias. Arrhythmias are problems with the speed or rhythm of the heartbeat. The monitor is a small, portable device. You can wear one while you do your normal daily activities. This is usually used to diagnose what is causing palpitations/syncope (passing out).  [employer] Cardiac Event Monitor Instructions Your physician has requested you wear your cardiac event monitor for  30 days, (1-30). [employer] may call or  text to confirm a shipping address. The monitor will be sent to a land address via UPS. [employer] will not ship a monitor to a PO BOX. It typically takes 3-5 days to receive your monitor after it has been enrolled. [employer] will assist with USPS tracking if your package is delayed. The telephone number for [employer] is [phone]. Once you have received your monitor, please review the enclosed instructions. Instruction tutorials can also be viewed under help and settings on the enclosed cell phone. Your monitor has already been registered assigning a specific monitor serial # to you.  Applying the monitor Remove cell phone from case and turn it on. The cell phone works as IT consultantyour transmitter and needs to be within [employer] feet of you at all times. The cell phone will need to be charged on a daily basis. We recommend you plug the cell phone into the enclosed charger at your bedside table every night.  Monitor batteries: You will receive two monitor batteries labelled #  1 and #2. These are your recorders. Plug battery #2 onto the second connection on the enclosed charger. Keep one battery on the charger at all times. This will keep the monitor battery deactivated. It will also keep it fully charged for when you need to switch your monitor batteries. A small light will be blinking on the battery emblem when it is charging. The light on the battery emblem will remain on when the battery is fully charged.  Open package of a Monitor strip. Insert battery #1 into black hood on strip and gently squeeze monitor battery onto connection as indicated in instruction booklet. Set aside while preparing skin.  Choose location for your strip, vertical or horizontal, as indicated in the instruction booklet. Shave to remove all hair from location. There cannot be any lotions, oils, powders, or colognes on skin where monitor is to be applied. Wipe skin clean with enclosed Saline wipe. Dry skin completely.  Peel  paper labeled #1 off the back of the Monitor strip exposing the adhesive. Place the monitor on the chest in the vertical or horizontal position shown in the instruction booklet. One arrow on the monitor strip must be pointing upward. Carefully remove paper labeled #2, attaching remainder of strip to your skin. Try not to create any folds or wrinkles in the strip as you apply it.  Firmly press and release the circle in the center of the monitor battery. You will hear a small beep. This is turning the monitor battery on. The heart emblem on the monitor battery will light up every 5 seconds if the monitor battery in turned on and connected to the patient securely. Do not push and hold the circle down as this turns the monitor battery off. The cell phone will locate the monitor battery. A screen will appear on the cell phone checking the connection of your monitor strip. This may read poor connection initially but change to good connection within the next minute. Once your monitor accepts the connection you will hear a series of 3 beeps followed by a climbing crescendo of beeps. A screen will appear on the cell phone showing the two monitor strip placement options. Touch the picture that demonstrates where you applied the monitor strip.  Your monitor strip and battery are waterproof. You are able to shower, bathe, or swim with the monitor on. They just ask you do not submerge deeper than 3 feet underwater. We recommend removing the monitor if you are swimming in a lake, river, or ocean.  Your monitor battery will need to be switched to a fully charged monitor battery approximately once a week. The cell phone will alert you of an action which needs to be made.  On the cell phone, tap for details to reveal connection status, monitor battery status, and cell phone battery status. The green dots indicates your monitor is in good status. A red dot indicates there is something that needs your  attention.  To record a symptom, click the circle on the monitor battery. In 30-60 seconds a list of symptoms will appear on the cell phone. Select your symptom and tap save. Your monitor will record a sustained or significant arrhythmia regardless of you clicking the button. Some patients do not feel the heart rhythm irregularities. [employer] will notify us of any serious or critical events.  Refer to instruction booklet for instructions on switching batteries, changing strips, the Do not disturb or Pause features, or any additional questions.  Call [employer] at [phone], to confirm  your monitor is transmitting and record your baseline. They will answer any questions you may have regarding the monitor instructions at that time.  Returning the monitor to [employer] Place all equipment back into blue box. Peel off strip of paper to expose adhesive and close box securely. There is a prepaid UPS shipping label on this box. Drop in a UPS drop box, or at a UPS facility like [employer]. You may also contact [employer] to arrange UPS to pick up monitor package at your home.    Follow-Up: At [facility], you and your health needs are our priority.  As part of our continuing mission to provide you with exceptional heart care, we have created designated Provider Care Teams.  These Care Teams include your primary Cardiologist (physician) and Advanced Practice Providers (APPs -  Physician Assistants and Nurse Practitioners) who all work together to provide you with the care you need, when you need it.  We recommend signing up for the patient portal called "MyChart".  Sign up information is provided on this After Visit Summary.  MyChart is used to connect with patients for Virtual Visits (Telemedicine).  Patients are able to view lab/test results, encounter notes, upcoming appointments, etc.  Non-urgent messages can be sent to your provider as well.   To learn more about what you can do with  MyChart, go to [email or web address].    Your next appointment:   AS NEEDED       Signed, Meriam Sprague, MD  01/27/2021 8:47 AM    [facility]

## 2021-01-27 ENCOUNTER — Encounter: Payer: Self-pay | Admitting: Cardiology

## 2021-01-27 ENCOUNTER — Ambulatory Visit: Payer: BC Managed Care – PPO | Admitting: Cardiology

## 2021-01-27 ENCOUNTER — Ambulatory Visit: Payer: Self-pay | Admitting: Surgery

## 2021-01-27 ENCOUNTER — Encounter: Payer: Self-pay | Admitting: *Deleted

## 2021-01-27 ENCOUNTER — Other Ambulatory Visit: Payer: Self-pay

## 2021-01-27 VITALS — BP 104/72 | HR 68 | Ht 73.0 in | Wt 241.0 lb

## 2021-01-27 DIAGNOSIS — I10 Essential (primary) hypertension: Secondary | ICD-10-CM | POA: Diagnosis not present

## 2021-01-27 DIAGNOSIS — R0789 Other chest pain: Secondary | ICD-10-CM | POA: Diagnosis not present

## 2021-01-27 DIAGNOSIS — R7303 Prediabetes: Secondary | ICD-10-CM | POA: Diagnosis not present

## 2021-01-27 DIAGNOSIS — R002 Palpitations: Secondary | ICD-10-CM | POA: Diagnosis not present

## 2021-01-27 DIAGNOSIS — Z8719 Personal history of other diseases of the digestive system: Secondary | ICD-10-CM | POA: Diagnosis not present

## 2021-01-27 LAB — LIPID PANEL
Chol/HDL Ratio: 2.8 ratio (ref 0.0–5.0)
Cholesterol, Total: 132 mg/dL (ref 100–199)
HDL: 48 mg/dL (ref >39)
LDL Chol Calc (NIH): 74 mg/dL (ref 0–99)
Triglycerides: 44 mg/dL (ref 0–149)
VLDL Cholesterol Cal: 10 mg/dL (ref 5–40)

## 2021-01-27 NOTE — Patient Instructions (Addendum)
Medication Instructions:  Your physician recommends that you continue on your current medications as directed. Please refer to the Current Medication list given to you today.  *If you need a refill on your cardiac medications before your next appointment, please call your pharmacy*   Lab Work: TODAY:  LIPID  If you have labs (blood work) drawn today and your tests are completely normal, you will receive your results only by: Marland Kitchen MyChart Message (if you have MyChart) OR . A paper copy in the mail If you have any lab test that is abnormal or we need to change your treatment, we will call you to review the results.   Testing/Procedures: Your physician has recommended that you wear an event monitor. Event monitors are medical devices that record the heart's electrical activity. Doctors most often [place] these monitors to diagnose arrhythmias. Arrhythmias are problems with the speed or rhythm of the heartbeat. The monitor is a small, portable device. You can wear one while you do your normal daily activities. This is usually used to diagnose what is causing palpitations/syncope (passing out).  [employer] Cardiac Event Monitor Instructions Your physician has requested you wear your cardiac event monitor for  30 days, (1-30). [employer] may call or text to confirm a shipping address. The monitor will be sent to a land address via UPS. [employer] will not ship a monitor to a PO BOX. It typically takes 3-5 days to receive your monitor after it has been enrolled. [employer] will assist with USPS tracking if your package is delayed. The telephone number for [employer] is [phone]. Once you have received your monitor, please review the enclosed instructions. Instruction tutorials can also be viewed under help and settings on the enclosed cell phone. Your monitor has already been registered assigning a specific monitor serial # to you.  Applying the monitor Remove cell phone from case and turn it on.  The cell phone works as IT consultant and needs to be within [employer] of you at all times. The cell phone will need to be charged on a daily basis. We recommend you plug the cell phone into the enclosed charger at your bedside table every night.  Monitor batteries: You will receive two monitor batteries labelled #1 and #2. These are your recorders. Plug battery #2 onto the second connection on the enclosed charger. Keep one battery on the charger at all times. This will keep the monitor battery deactivated. It will also keep it fully charged for when you need to switch your monitor batteries. A small light will be blinking on the battery emblem when it is charging. The light on the battery emblem will remain on when the battery is fully charged.  Open package of a Monitor strip. Insert battery #1 into black hood on strip and gently squeeze monitor battery onto connection as indicated in instruction booklet. Set aside while preparing skin.  Choose location for your strip, vertical or horizontal, as indicated in the instruction booklet. Shave to remove all hair from location. There cannot be any lotions, oils, powders, or colognes on skin where monitor is to be applied. Wipe skin clean with enclosed Saline wipe. Dry skin completely.  Peel paper labeled #1 off the back of the Monitor strip exposing the adhesive. Place the monitor on the chest in the vertical or horizontal position shown in the instruction booklet. One arrow on the monitor strip must be pointing upward. Carefully remove paper labeled #2, attaching remainder of strip to your skin. Try not to create  any folds or wrinkles in the strip as you apply it.  Firmly press and release the circle in the center of the monitor battery. You will hear a small beep. This is turning the monitor battery on. The heart emblem on the monitor battery will light up every 5 seconds if the monitor battery in turned on and connected to the patient  securely. Do not push and hold the circle down as this turns the monitor battery off. The cell phone will locate the monitor battery. A screen will appear on the cell phone checking the connection of your monitor strip. This may read poor connection initially but change to good connection within the next minute. Once your monitor accepts the connection you will hear a series of 3 beeps followed by a climbing crescendo of beeps. A screen will appear on the cell phone showing the two monitor strip placement options. Touch the picture that demonstrates where you applied the monitor strip.  Your monitor strip and battery are waterproof. You are able to shower, bathe, or swim with the monitor on. They just ask you do not submerge deeper than 3 feet underwater. We recommend removing the monitor if you are swimming in a lake, river, or ocean.  Your monitor battery will need to be switched to a fully charged monitor battery approximately once a week. The cell phone will alert you of an action which needs to be made.  On the cell phone, tap for details to reveal connection status, monitor battery status, and cell phone battery status. The green dots indicates your monitor is in good status. A red dot indicates there is something that needs your attention.  To record a symptom, click the circle on the monitor battery. In 30-60 seconds a list of symptoms will appear on the cell phone. Select your symptom and tap save. Your monitor will record a sustained or significant arrhythmia regardless of you clicking the button. Some patients do not feel the heart rhythm irregularities. [employer] will notify us of any serious or critical events.  Refer to instruction booklet for instructions on switching batteries, changing strips, the Do not disturb or Pause features, or any additional questions.  Call [employer] at [phone], to confirm your monitor is transmitting and record your baseline. They  will answer any questions you may have regarding the monitor instructions at that time.  Returning the monitor to [employer] Place all equipment back into blue box. Peel off strip of paper to expose adhesive and close box securely. There is a prepaid UPS shipping label on this box. Drop in a UPS drop box, or at a UPS facility like [employer]. You may also contact [employer] to arrange UPS to pick up monitor package at your home.    Follow-Up: At [facility], you and your health needs are our priority.  As part of our continuing mission to provide you with exceptional heart care, we have created designated Provider Care Teams.  These Care Teams include your primary Cardiologist (physician) and Advanced Practice Providers (APPs -  Physician Assistants and Nurse Practitioners) who all work together to provide you with the care you need, when you need it.  We recommend signing up for the patient portal called "MyChart".  Sign up information is provided on this After Visit Summary.  MyChart is used to connect with patients for Virtual Visits (Telemedicine).  Patients are able to view lab/test results, encounter notes, upcoming appointments, etc.  Non-urgent messages can be sent to your provider as well.  To learn more about what you can do with MyChart, go to [email or web address].    Your next appointment:   AS NEEDED

## 2021-01-27 NOTE — [facility] Notes (Signed)
Patient ID: Chad Morris, male   DOB: 1976/01/05, 45 y.o.   MRN: [id number] Patient enrolled for [employer] to ship a 30 day Cardiac event monitor to his home.

## 2021-01-28 ENCOUNTER — Ambulatory Visit (INDEPENDENT_AMBULATORY_CARE_PROVIDER_SITE_OTHER): Payer: BC Managed Care – PPO | Admitting: Family Medicine

## 2021-01-28 ENCOUNTER — Encounter: Payer: Self-pay | Admitting: Family Medicine

## 2021-01-28 VITALS — BP 118/70 | HR 71 | Temp 97.8°F | Ht 73.0 in | Wt 241.8 lb

## 2021-01-28 DIAGNOSIS — F1290 Cannabis use, unspecified, uncomplicated: Secondary | ICD-10-CM | POA: Diagnosis not present

## 2021-01-28 DIAGNOSIS — R7303 Prediabetes: Secondary | ICD-10-CM

## 2021-01-28 DIAGNOSIS — K2100 Gastro-esophageal reflux disease with esophagitis, without bleeding: Secondary | ICD-10-CM

## 2021-01-28 DIAGNOSIS — K3580 Unspecified acute appendicitis: Secondary | ICD-10-CM | POA: Diagnosis not present

## 2021-01-28 DIAGNOSIS — Z113 Encounter for screening for infections with a predominantly sexual mode of transmission: Secondary | ICD-10-CM

## 2021-01-28 DIAGNOSIS — L301 Dyshidrosis [pompholyx]: Secondary | ICD-10-CM | POA: Diagnosis not present

## 2021-01-28 MED ORDER — PANTOPRAZOLE SODIUM 40 MG PO TBEC: 40.0000 mg | DELAYED_RELEASE_TABLET | Freq: Two times a day (BID) | ORAL | 3 refills | Status: AC

## 2021-01-28 NOTE — [facility] Notes (Signed)
[facility] [address] [place] [address] Dept: [phone] Dept Fax: [phone]  Transfer of Care Office Visit  Subjective:    Patient ID: Chad Morris, male    DOB: 1976-04-25, 45 y.o..   MRN: [id number]  Chief Complaint  Patient presents with  . Transitions Of Care    TOC from Dr. Daphine Deutscher, no concerns.     History of Present Illness:  Patient is in today to establish care. 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 He was admitted to the hospital in late Jan. With an acute appendicitis. However, he also had COVID-19 at that point. He was treated with antibiotics and discharge to have his appendectomy at a later date, after his COVID resolves. He is currently on Cipro and Flagyl. He notes his pain is improved from previously. He states he is awaiting the call to give him a date for his surgery, anticipating mid-March.  Mr. Schoonmaker has a history of GERD, well managed with Protonix. He has had prior issues with hammer toes bilaterally and has had surgical correction of these with podiatry.  Mr. Shake was diagnosed with pre-diabetes about 4 years ago. His most recent HbA1c was 5.4. He notes that he did lose about 30 lbs from where his weight was then. He was previously on metformin, but now is off this medication.  Mr. Belsito admits to marijuana use, smoking about 1 oz. daily. He smells very strongly of marijuana today and admits he smoked a joint prior to coming in for his appointment.  Mr. Jurney asks about having an STD check performed. He has had more than 1 partner in the past few months. Although he wears condoms, he notes he had an episode where the condom ruptured. He has not had symptoms of an STD.  Past Medical History: Patient Active Problem List   Diagnosis Date Noted  . Marijuana use, continuous 01/28/2021  . Dyshidrotic hand dermatitis, Left 01/28/2021  . Acute appendicitis 01/02/2021  .  Gastroesophageal reflux disease with esophagitis without hemorrhage 08/27/2020  . Acute esophagitis 08/27/2020  . Prediabetes 01/31/2020   Past Surgical History:  Procedure Laterality Date  . FOOT SURGERY Right    right 5th digit  . UPPER GASTROINTESTINAL ENDOSCOPY     Family History  Problem Relation Age of Onset  . Leukemia Mother   . Stroke Father   . Breast cancer Sister        26  . Colon cancer Neg Hx   . Esophageal cancer Neg Hx   . Rectal cancer Neg Hx   . Stomach cancer Neg Hx    Outpatient Medications Prior to Visit  Medication Sig Dispense Refill  . ciprofloxacin (CIPRO) 500 MG tablet Take 1 tablet (500 mg total) by mouth 2 (two) times daily. 20 tablet 0  . clotrimazole-betamethasone (LOTRISONE) cream Apply 1 application topically 2 (two) times daily. 30 g 0  . metroNIDAZOLE (FLAGYL) 500 MG tablet Take 1 tablet (500 mg total) by mouth every 8 (eight) hours. 30 tablet 0  . pantoprazole (PROTONIX) 40 MG tablet TAKE 1 TABLET BY MOUTH TWICE A DAY 60 tablet 3  . oxyCODONE (OXY IR/ROXICODONE) 5 MG immediate release tablet Take 1 tablet (5 mg total) by mouth every 6 (six) hours as needed for moderate pain. (Patient not taking: Reported on 01/28/2021) 20 tablet 0   No facility-administered medications prior to visit.   Allergies  Allergen Reactions  . Penicillins     Diagnosed  as a child, does not recall Sx   Objective:   Today's Vitals   01/28/21 1409  BP: 118/70  Pulse: 71  Temp: 97.8 F (36.6 C)  TempSrc: Temporal  SpO2: 96%  Weight: 241 lb 12.8 oz (109.7 kg)  Height: 6\' 1"  (1.854 m)   Body mass index is 31.9 kg/m.   General: Well developed, well nourished. No acute distress. HEENT: Normocephalic, non-traumatic. PERRL, EOMI. Conjunctiva clear. Fundiscopic exam shows normal disc and vasculature. External ears normal. EAC and TMs normal bilaterally. Nose clear without congestion or rhinorrhea. Mucous membranes moist. Oropharynx clear. Good dentition. Neck:  Supple. No lymphadenopathy. No thyromegaly. Lungs: Clear to auscultation bilaterally. CV: RRR without murmurs or rubs. Pulses 2+ bilaterally. Abdomen: Soft, non-tender. No hepatosplenomegaly. No rebound or guarding. Extremities: Full ROM. No joint swelling or tenderness. No edema noted. Back: Straight. No CVA tenderness bilaterally. Skin: Warm and dry. No rashes. Neuro:CN II-XII intact. Normal sensation and DTR bilaterally. Psych: Alert and oriented x3. Normal mood and affect.  Health Maintenance Due  Topic Date Due  . Hepatitis C Screening  Never done     Assessment & Plan:   1. Acute appendicitis, unspecified acute appendicitis type Surgery delayed due to active COVID at the time of presentation. On Cipro and Flagyl. Patient to continue to follow with surgeons.  2. Prediabetes Last levels were normal. Suspect weight loss has allowed this to normalize. Recommend annual screening for HbA1c. At present, recommend waiting to after his pending surgery.  3. Marijuana use, continuous Requested patient not smoke just prior to coming in for his appointments, as the smell of marijuana was quite strong and was difficult for me to tolerate in a closed space.  4. Dyshidrotic hand dermatitis, Left On Lotrisone cream.  5. Gastroesophageal reflux disease with esophagitis without hemorrhage Stable on Protonix.  - pantoprazole (PROTONIX) 40 MG tablet; Take 1 tablet (40 mg total) by mouth 2 (two) times daily.  Dispense: 60 tablet; Refill: 3  6. Screening for STD (sexually transmitted disease) Counseled to have a repeat HIV test in 3 months.  - GC/Chlamydia Probe Amp - HIV Antibody (routine testing w rflx)   , MD

## 2021-01-29 LAB — HIV ANTIBODY (ROUTINE TESTING W REFLEX): HIV 1&2 Ab, 4th Generation: NONREACTIVE

## 2021-01-30 ENCOUNTER — Other Ambulatory Visit: Payer: Self-pay | Admitting: Physician Assistant

## 2021-01-30 LAB — CHLAMYDIA/GC NAA, CONFIRMATION
Chlamydia trachomatis, NAA: NEGATIVE
Neisseria gonorrhoeae, NAA: NEGATIVE

## 2021-01-30 MED ORDER — CLOTRIMAZOLE-BETAMETHASONE 1-0.05 % EX CREA
1.0000 | TOPICAL_CREAM | Freq: Two times a day (BID) | CUTANEOUS | 0 refills | Status: DC
Start: 2021-01-30 — End: 2021-10-28

## 2021-01-30 NOTE — Telephone Encounter (Signed)
Rx was sent to me 

## 2021-02-01 ENCOUNTER — Encounter (INDEPENDENT_AMBULATORY_CARE_PROVIDER_SITE_OTHER): Payer: BC Managed Care – PPO

## 2021-02-01 DIAGNOSIS — R002 Palpitations: Secondary | ICD-10-CM | POA: Diagnosis not present

## 2021-02-10 NOTE — Patient Instructions (Signed)
DUE TO COVID-19 ONLY ONE VISITOR IS ALLOWED TO COME WITH YOU AND STAY IN THE WAITING ROOM ONLY DURING PRE OP AND PROCEDURE.   IF YOU WILL BE ADMITTED INTO THE HOSPITAL YOU ARE ALLOWED ONLY TWO SUPPORT PEOPLE DURING VISITATION HOURS ONLY (10AM -8PM)   . The support person(s) may change daily. . The support person(s) must pass our screening, gel in and out, and wear a mask at all times, including in the patient's room. . Patients must also wear a mask when staff or their support person are in the room.  No visitors under the age of 36. Any visitor under the age of 67 must be accompanied by an adult.      Your procedure is scheduled on:  Tuesday, 02-18-21   Report to [facility] Main  Entrance   Report to Short Stay at 5:30 AM   [facility])    Call this number if you have problems the morning of surgery [phone]   Do not eat food :After Midnight.   May have liquids until 4:30 AM  day of surgery  CLEAR LIQUID DIET  Foods Allowed                                                                     Foods Excluded  Water, Black Coffee and tea, regular and decaf            liquids that you cannot  Plain Jell-O in any flavor  (No red)                                   see through such as: Fruit ices (not with fruit pulp)                                      milk, soups, orange juice              Iced Popsicles (No red)                                      All solid food                                   Apple juices Sports drinks like Gatorade (No red) Lightly seasoned clear broth or consume(fat free) Sugar, honey syrup      Oral Hygiene is also important to reduce your risk of infection.                                    Remember - BRUSH YOUR TEETH THE MORNING OF SURGERY WITH YOUR REGULAR TOOTHPASTE   Do NOT smoke after Midnight   Take these medicines the morning of surgery with A SIP OF WATER:  Cipro, Metronidazole, Pantoprazole  You  may not have any metal on your body including  jewelry, and body piercings             Do not wear lotions, powders, perfumes/cologne, or deodorant             Men may shave face and neck.   Do not bring valuables to the hospital. [facility] IS NOT             RESPONSIBLE   FOR VALUABLES.   Contacts, dentures or bridgework may not be worn into surgery.   Bring small overnight bag day of surgery.    Special Instructions: Bring a copy of your healthcare power of attorney and living will documents         the day of surgery if you haven't  scanned them in before.              Please read over the following fact sheets you were given: IF YOU HAVE QUESTIONS ABOUT YOUR PRE OP INSTRUCTIONS PLEASE CALL  [phone]   [place] - Preparing for Surgery Before surgery, you can play an important role.  Because skin is not sterile, your skin needs to be as free of germs as possible.  You can reduce the number of germs on your skin by washing with CHG (chlorahexidine gluconate) soap before surgery.  CHG is an antiseptic cleaner which kills germs and bonds with the skin to continue killing germs even after washing. Please DO NOT use if you have an allergy to CHG or antibacterial soaps.  If your skin becomes reddened/irritated stop using the CHG and inform your nurse when you arrive at Short Stay. Do not shave (including legs and underarms) for at least 48 hours prior to the first CHG shower.  You may shave your face/neck.  Please follow these instructions carefully:  1.  Shower with CHG Soap the night before surgery and the  morning of surgery.  2.  If you choose to wash your hair, wash your hair first as usual with your normal  shampoo.  3.  After you shampoo, rinse your hair and body thoroughly to remove the shampoo.                             4.  Use CHG as you would any other liquid soap.  You can apply chg directly to the skin and wash.  Gently with a scrungie or clean washcloth.  5.  Apply the  CHG Soap to your body ONLY FROM THE NECK DOWN.   Do   not use on face/ open                           Wound or open sores. Avoid contact with eyes, ears mouth and   genitals (private parts).                       Wash face,  Genitals (private parts) with your normal soap.             6.  Wash thoroughly, paying special attention to the area where your    surgery  will be performed.  7.  Thoroughly rinse your body with warm water from the neck down.  8.  DO NOT shower/wash with your normal soap after using and rinsing off the CHG Soap.  9.  Pat yourself dry with a clean towel.            10.  Wear clean pajamas.            11.  Place clean sheets on your bed the night of your first shower and do not  sleep with pets. Day of Surgery : Do not apply any lotions/deodorants the morning of surgery.  Please wear clean clothes to the hospital/surgery center.  FAILURE TO FOLLOW THESE INSTRUCTIONS MAY RESULT IN THE CANCELLATION OF YOUR SURGERY  PATIENT SIGNATURE_________________________________  NURSE SIGNATURE__________________________________  ________________________________________________________________________

## 2021-02-10 NOTE — Progress Notes (Signed)
COVID Vaccine Completed: Date COVID Vaccine completed: Has received booster: COVID vaccine manufacturer: Pfizer    [employer]   Date of COVID positive in last 90 days:  01-01-21 results in Epic  PCP - Herbie Drape, MD Cardiologist - Laurance Flatten, MD  Chest x-ray - 01-02-21 Epic EKG - 12-31-20 Epic Stress Test -  ECHO -  Cardiac Cath -  Pacemaker/ICD device last checked: Spinal Cord Stimulator:  Sleep Study -  CPAP -   Fasting Blood Sugar -  Checks Blood Sugar _____ times a day  Blood Thinner Instructions: Aspirin Instructions: Last Dose:  Activity level:  Can go up a flight of stairs and perform activities of daily living without stopping and without symptoms of chest pain or shortness of breath.   Able to exercise without symptoms  Unable to go up a flight of stairs without symptoms of      Anesthesia review: Hx of palpitations and chest pain evaluated by cardiology.  Event monitor ordered.  Patient denies shortness of breath, fever, cough and chest pain at PAT appointment   Patient verbalized understanding of instructions that were given to them at the PAT appointment. Patient was also instructed that they will need to review over the PAT instructions again at home before surgery.

## 2021-02-11 ENCOUNTER — Inpatient Hospital Stay (HOSPITAL_COMMUNITY)
Admission: RE | Admit: 2021-02-11 | Discharge: 2021-02-11 | Disposition: A | Discharge status: Home / Self Care | Payer: BC Managed Care – PPO | Source: Ambulatory Visit

## 2021-02-12 ENCOUNTER — Encounter (HOSPITAL_COMMUNITY)
Admission: RE | Admit: 2021-02-12 | Discharge: 2021-02-12 | Disposition: A | Discharge status: Home / Self Care | Payer: BC Managed Care – PPO | Source: Ambulatory Visit | Attending: Surgery | Admitting: Surgery

## 2021-02-12 ENCOUNTER — Encounter (HOSPITAL_COMMUNITY): Payer: Self-pay

## 2021-02-12 ENCOUNTER — Other Ambulatory Visit: Payer: Self-pay

## 2021-02-12 DIAGNOSIS — Z01812 Encounter for preprocedural laboratory examination: Secondary | ICD-10-CM | POA: Diagnosis not present

## 2021-02-12 HISTORY — DX: Prediabetes: R73.03

## 2021-02-12 HISTORY — DX: Cardiac arrhythmia, unspecified: I49.9

## 2021-02-12 HISTORY — DX: Angina pectoris, unspecified: I20.9

## 2021-02-12 LAB — BASIC METABOLIC PANEL
Anion gap: 6 (ref 5–15)
BUN: 10 mg/dL (ref 6–20)
CO2: 24 mmol/L (ref 22–32)
Calcium: 9 mg/dL (ref 8.9–10.3)
Chloride: 107 mmol/L (ref 98–111)
Creatinine, Ser: 1.03 mg/dL (ref 0.61–1.24)
GFR, Estimated: >60 mL/min (ref >60)
Glucose, Bld: 90 mg/dL (ref 70–99)
Potassium: 4.2 mmol/L (ref 3.5–5.1)
Sodium: 137 mmol/L (ref 135–145)

## 2021-02-12 LAB — CBC
HCT: 45.2 % (ref 39.0–52.0)
Hemoglobin: 15 g/dL (ref 13.0–17.0)
MCH: 30.4 pg (ref 26.0–34.0)
MCHC: 33.2 g/dL (ref 30.0–36.0)
MCV: 91.7 fL (ref 80.0–100.0)
Platelets: 220 10*3/uL (ref 150–400)
RBC: 4.93 MIL/uL (ref 4.22–5.81)
RDW: 13.3 % (ref 11.5–15.5)
WBC: 5.5 10*3/uL (ref 4.0–10.5)
nRBC: 0 % (ref 0.0–0.2)

## 2021-02-12 LAB — HEMOGLOBIN A1C
Hgb A1c MFr Bld: 5.2 % (ref 4.8–5.6)
Mean Plasma Glucose: 102.54 mg/dL

## 2021-02-12 NOTE — Progress Notes (Signed)
COVID Vaccine Completed:No Date COVID Vaccine completed: COVID vaccine manufacturer: Pfizer    Moderna   [employer]   PCP - Dr. Vickii Penna Cardiologist - Dr. Jon Billings  Chest x-ray - 1.27/22-epic EKG - 12/31/20-epic Stress Test - no ECHO - no Cardiac Cath - no Pacemaker/ICD device last checked:NA  Sleep Study - no CPAP -   Fasting Blood Sugar - NA Checks Blood Sugar _____ times a day  Blood Thinner Instructions:no Aspirin Instructions: Last Dose:  Anesthesia review:   Patient denies shortness of breath, fever, cough and chest pain at PAT appointment Yes  Patient verbalized understanding of instructions that were given to them at the PAT appointment. Patient was also instructed that they will need to review over the PAT instructions again at home before surgery.yes Pt has no SOB with any activities. No cardiac issues.

## 2021-02-12 NOTE — Patient Instructions (Signed)
DUE TO COVID-19 ONLY ONE VISITOR IS ALLOWED TO COME WITH YOU AND STAY IN THE WAITING ROOM ONLY DURING PRE OP AND PROCEDURE.   IF YOU WILL BE ADMITTED INTO THE HOSPITAL YOU ARE ALLOWED ONLY TWO SUPPORT PEOPLE DURING VISITATION HOURS ONLY (10AM -8PM)   . The support person(s) may change daily. . The support person(s) must pass our screening, gel in and out, and wear a mask at all times, including in the patient's room. . Patients must also wear a mask when staff or their support person are in the room.  No visitors under the age of 56. Any visitor under the age of 46 must be accompanied by an adult.          Your procedure is scheduled on: 02/18/21   Report to [facility] Main  Entrance   Report to Short Stay at 5:30 AM   [facility])       Call this number if you have problems the morning of surgery [phone]   Do not eat food :After Midnight.   May have liquids until 4:30 am   day of surgery  CLEAR LIQUID DIET  Foods Allowed                                                                     Foods Excluded  Water, Black Coffee and tea, regular and decaf                             liquids that you cannot  Plain Jell-O in any flavor  (No red)                                           see through such as: Fruit ices (not with fruit pulp)                                     milk, soups, orange juice              Iced Popsicles (No red)                                    All solid food                                   Apple juices Sports drinks like Gatorade (No red) Lightly seasoned clear broth or consume(fat free) Sugar, honey syrup        1. The day of surgery:  ? Drink ONE (1) Pre-Surgery Clear Ensure or G2 by am the morning of surgery. Drink in one sitting. Do not sip.  ? This drink was given to you during your hospital  pre-op appointment visit. ? Nothing else to drink after completing the  Pre-Surgery Clear Ensure           If you have questions,  please contact  your surgeon's office.     Oral Hygiene is also important to reduce your risk of infection.                                    Remember - BRUSH YOUR TEETH THE MORNING OF SURGERY WITH YOUR REGULAR TOOTHPASTE   Do NOT smoke after Midnight   Take these medicines the morning of surgery with A SIP OF WATER: Cipro, Pantoprazole, metronidazole  DO NOT TAKE ANY ORAL DIABETIC MEDICATIONS DAY OF YOUR SURGERY                               You may not have any metal on your body including hair pins, jewelry, and body piercings             Do not wear make-up, lotions, powders, perfumes/cologne, or deodorant                         Men may shave face and neck.   Do not bring valuables to the hospital. [facility] IS NOT             RESPONSIBLE   FOR VALUABLES.   Contacts, dentures or bridgework may not be worn into surgery.   Bring small overnight bag day of surgery.    Patients discharged the day of surgery will not be allowed to drive home.   .              Please read over the following fact sheets you were given: IF YOU HAVE QUESTIONS ABOUT YOUR PRE OP INSTRUCTIONS PLEASE CALL [phone]   - Preparing for Surgery Before surgery, you can play an important role.  Because skin is not sterile, your skin needs to be as free of germs as possible.  You can reduce the number of germs on your skin by washing with CHG (chlorahexidine gluconate) soap before surgery.  CHG is an antiseptic cleaner which kills germs and bonds with the skin to continue killing germs even after washing. Please DO NOT use if you have an allergy to CHG or antibacterial soaps.  If your skin becomes reddened/irritated stop using the CHG and inform your nurse when you arrive at Short Stay. .  You may shave your face/neck.  Please follow these instructions carefully:  1.  Shower with CHG Soap the night before surgery and the  morning of Surgery.  2.  If you choose to wash your hair, wash your hair  first as usual with your  normal  shampoo.  3.  After you shampoo, rinse your hair and body thoroughly to remove the  shampoo.                                        4.  Use CHG as you would any other liquid soap.  You can apply chg directly  to the skin and wash                       Gently with a scrungie or clean washcloth.  5.  Apply the CHG Soap to your body ONLY FROM THE NECK DOWN.   Do not use on face/ open  Wound or open sores. Avoid contact with eyes, ears mouth and genitals (private parts).                       Wash face,  Genitals (private parts) with your normal soap.             6.  Wash thoroughly, paying special attention to the area where your surgery  will be performed.  7.  Thoroughly rinse your body with warm water from the neck down.  8.  DO NOT shower/wash with your normal soap after using and rinsing off  the CHG Soap.             9.  Pat yourself dry with a clean towel.            10.  Wear clean pajamas.            11.  Place clean sheets on your bed the night of your first shower and do not  sleep with pets. Day of Surgery : Do not apply any lotions/deodorants the morning of surgery.  Please wear clean clothes to the hospital/surgery center.  FAILURE TO FOLLOW THESE INSTRUCTIONS MAY RESULT IN THE CANCELLATION OF YOUR SURGERY PATIENT SIGNATURE_________________________________  NURSE SIGNATURE__________________________________  ________________________________________________________________________

## 2021-02-13 NOTE — Progress Notes (Signed)
The patient was scheduled for pre-procedural COVID-19 testing on 02/15/21. Patient tested positive for COVID-19 on 01/02/21. Patient is unable to be tested due to prior positive COVID-19 test within the past 90 days per policy. Called patient and spoke to patient's wife. Advised patient's wife and cancelled appointment. Patient's wife expressed understanding.

## 2021-02-15 ENCOUNTER — Inpatient Hospital Stay (HOSPITAL_COMMUNITY)
Admission: RE | Admit: 2021-02-15 | Discharge: 2021-02-15 | Disposition: A | Discharge status: Home / Self Care | Payer: BC Managed Care – PPO | Source: Ambulatory Visit

## 2021-02-16 ENCOUNTER — Encounter (HOSPITAL_COMMUNITY): Payer: Self-pay | Admitting: Surgery

## 2021-02-16 DIAGNOSIS — Z8719 Personal history of other diseases of the digestive system: Secondary | ICD-10-CM

## 2021-02-16 NOTE — H&P (Signed)
General Surgery [name]  Chad Morris DOB: Oct 08, 1976 Married / Language: English / Race: Black or African American Male   History of Present Illness   The patient is a 45 year old male who presents with appendicitis.  CHIEF COMPLAINT: acute appendicitis, evaluate for interval appendectomy  Patient returns for follow-up having been managed as an inpatient at [name] in late January 2022. Patient had presented with clinical evidence of acute appendicitis. This was confirmed on CT scan showing an inflamed retrocecal appendix. Unfortunately the patient tested positive for Covid. A decision was made with the patient to manage medically. He was hospitalized for 3 days on intravenous antibiotics and then discharged home on oral antibiotics for an additional week. Patient has done well without recurrent symptoms. He denies any abdominal pain. He is eating a normal diet. He is having normal bowel movements. He returns today to discuss appendectomy at some point in the near future. Patient has had no prior history of abdominal surgery.   Problem List/Past Medical INGUINAL LYMPHADENOPATHY (R59.0)  HISTORY OF APPENDICITIS (Z87.19)   Past Surgical History  No pertinent past surgical history   Allergies  Penicillins  Allergies Reconciled   Medication History  Pantoprazole Sodium (20MG  Tablet DR, Oral) Active. Ciprofloxacin HCl (100MG  Tablet, Oral) Active. metroNIDAZOLE (500MG  Tablet, Oral) Active. oxyCODONE HCl (100MG /5ML Concentrate, Oral) Active. Clotrimazole (1% Cream, External) Active. Protonix (20MG  Tablet DR, Oral) Active. PriLOSEC (10MG  Packet, Oral) Active. Protonix (40MG  Tablet DR, Oral) Active. Medications Reconciled  Other Problems No pertinent past medical history   Vitals  Weight: 241 lb Height: 72in Body Surface Area: 2.31 m Body Mass Index: 32.69 kg/m  Temp.: 97.10F  Pulse: 75 (Regular)   P.OX: 96% (Room air) BP: 140/80(Sitting, Left Arm, Standard)  Physical Exam  GENERAL APPEARANCE Development: normal Nutritional status: normal Gross deformities: none Patient smells strongly of marijuana.  SKIN Rash, lesions, ulcers: none Induration, erythema: none Nodules: none palpable  EYES Conjunctiva and lids: normal Pupils: equal and reactive Iris: normal bilaterally  EARS, NOSE, MOUTH, THROAT External ears: no lesion or deformity External nose: no lesion or deformity Hearing: grossly normal Due to Covid-19 pandemic, patient is wearing a mask.  NECK Symmetric: yes Trachea: midline Thyroid: no palpable nodules in the thyroid bed  CHEST Respiratory effort: normal Retraction or accessory muscle use: no Breath sounds: normal bilaterally Rales, rhonchi, wheeze: none  CARDIOVASCULAR Auscultation: regular rhythm, normal rate Murmurs: none Pulses: radial pulse 2+ palpable Lower extremity edema: none  ABDOMEN Distension: none Masses: none palpable Tenderness: none Hepatosplenomegaly: not present Hernia: not present  MUSCULOSKELETAL Station and gait: normal Digits and nails: no clubbing or cyanosis Muscle strength: grossly normal all extremities Range of motion: grossly normal all extremities Deformity: none  LYMPHATIC Cervical: none palpable Supraclavicular: none palpable  PSYCHIATRIC Oriented to person, place, and time: yes Mood and affect: normal for situation Judgment and insight: appropriate for situation    Assessment & Plan   HISTORY OF APPENDICITIS (Z87.19)  Patient returns to my practice for follow-up having been admitted on the inpatient service with acute appendicitis. He was managed medically due to concurrent Covid infection. He has now improved without residual symptoms. He completed his course of antibiotics. Patient is interested in proceeding with appendectomy to prevent recurrence in the future.  We discussed laparoscopic  appendectomy. We discussed the location of the surgical incisions. We discussed general anesthesia. We discussed the possibility of conversion to open surgery. We discussed the hospital stay to be anticipated  and his postoperative recovery. We discussed the option of not performing appendectomy and the risk of recurrent disease. The patient understands and wishes to proceed with appendectomy in the near future.  The risks and benefits of the procedure have been discussed at length with the patient. The patient understands the proposed procedure, potential alternative treatments, and the course of recovery to be expected. All of the patient's questions have been answered at this time. The patient wishes to proceed with surgery.  [name], MD [name] Office: [phone]

## 2021-02-17 NOTE — Anesthesia Preprocedure Evaluation (Addendum)
Anesthesia Evaluation  Patient identified by MRN, date of birth, ID band Patient awake    Reviewed: Allergy & Precautions, NPO status , Patient's Chart, lab work & pertinent test results  History of Anesthesia Complications Negative for: history of anesthetic complications  Airway Mallampati: II  TM Distance: >3 FB Neck ROM: Full    Dental  (+) Missing,    Pulmonary former smoker,    Pulmonary exam normal        Cardiovascular negative cardio ROS Normal cardiovascular exam     Neuro/Psych negative neurological ROS  negative psych ROS   GI/Hepatic hiatal hernia, GERD  Controlled and Medicated,(+)     substance abuse  marijuana use,   Endo/Other  negative endocrine ROS  Renal/GU negative Renal ROS  negative genitourinary   Musculoskeletal negative musculoskeletal ROS (+)   Abdominal   Peds  Hematology negative hematology ROS (+)   Anesthesia Other Findings Day of surgery medications reviewed with patient.  Reproductive/Obstetrics negative OB ROS                           Anesthesia Physical Anesthesia Plan  ASA: II  Anesthesia Plan: General   Post-op Pain Management:    Induction: Intravenous  PONV Risk Score and Plan: 3 and Treatment may vary due to age or medical condition, Ondansetron, Dexamethasone and Midazolam  Airway Management Planned: Oral ETT  Additional Equipment: None  Intra-op Plan:   Post-operative Plan: Extubation in OR  Informed Consent: I have reviewed the patients History and Physical, chart, labs and discussed the procedure including the risks, benefits and alternatives for the proposed anesthesia with the patient or authorized representative who has indicated his/her understanding and acceptance.     Dental advisory given  Plan Discussed with:   Anesthesia Plan Comments:        Anesthesia Quick Evaluation

## 2021-02-18 ENCOUNTER — Other Ambulatory Visit: Payer: Self-pay

## 2021-02-18 ENCOUNTER — Ambulatory Visit (HOSPITAL_COMMUNITY): Payer: BC Managed Care – PPO | Admitting: Anesthesiology

## 2021-02-18 ENCOUNTER — Encounter (HOSPITAL_COMMUNITY): Payer: Self-pay | Admitting: Surgery

## 2021-02-18 ENCOUNTER — Ambulatory Visit (HOSPITAL_COMMUNITY)
Admission: RE | Admit: 2021-02-18 | Discharge: 2021-02-18 | Disposition: A | Discharge status: Home / Self Care | Payer: BC Managed Care – PPO | Attending: Surgery | Admitting: Surgery

## 2021-02-18 ENCOUNTER — Encounter (HOSPITAL_COMMUNITY)
Admission: RE | Disposition: A | Discharge status: Home / Self Care | Payer: Self-pay | Source: Home / Self Care | Attending: Surgery

## 2021-02-18 DIAGNOSIS — K449 Diaphragmatic hernia without obstruction or gangrene: Secondary | ICD-10-CM | POA: Diagnosis not present

## 2021-02-18 DIAGNOSIS — K388 Other specified diseases of appendix: Secondary | ICD-10-CM | POA: Diagnosis not present

## 2021-02-18 DIAGNOSIS — Z8616 Personal history of COVID-19: Secondary | ICD-10-CM | POA: Insufficient documentation

## 2021-02-18 DIAGNOSIS — Z87891 Personal history of nicotine dependence: Secondary | ICD-10-CM | POA: Diagnosis not present

## 2021-02-18 DIAGNOSIS — K219 Gastro-esophageal reflux disease without esophagitis: Secondary | ICD-10-CM | POA: Diagnosis not present

## 2021-02-18 DIAGNOSIS — Z8719 Personal history of other diseases of the digestive system: Secondary | ICD-10-CM

## 2021-02-18 DIAGNOSIS — K3580 Unspecified acute appendicitis: Secondary | ICD-10-CM | POA: Diagnosis present

## 2021-02-18 DIAGNOSIS — R7303 Prediabetes: Secondary | ICD-10-CM | POA: Diagnosis not present

## 2021-02-18 DIAGNOSIS — Z79899 Other long term (current) drug therapy: Secondary | ICD-10-CM | POA: Diagnosis not present

## 2021-02-18 HISTORY — PX: LAPAROSCOPIC APPENDECTOMY: SHX408

## 2021-02-18 SURGERY — APPENDECTOMY, LAPAROSCOPIC
Anesthesia: General

## 2021-02-18 MED ORDER — METRONIDAZOLE IN NACL 5-0.79 MG/ML-% IV SOLN
500.0000 mg | INTRAVENOUS | Status: AC
  Administered 2021-02-18: 500 mg via INTRAVENOUS
  Filled 2021-02-18: qty 100

## 2021-02-18 MED ORDER — FENTANYL CITRATE (PF) 100 MCG/2ML IJ SOLN
INTRAMUSCULAR | Status: DC | PRN
  Administered 2021-02-18 (×4): 50 ug via INTRAVENOUS
  Administered 2021-02-18: 100 ug via INTRAVENOUS

## 2021-02-18 MED ORDER — ACETAMINOPHEN 500 MG PO TABS
1000.0000 mg | ORAL_TABLET | Freq: Once | ORAL | Status: AC
  Administered 2021-02-18: 1000 mg via ORAL
  Filled 2021-02-18: qty 2

## 2021-02-18 MED ORDER — PROPOFOL 10 MG/ML IV BOLUS
INTRAVENOUS | Status: DC | PRN
  Administered 2021-02-18: 200 mg via INTRAVENOUS

## 2021-02-18 MED ORDER — OXYCODONE HCL 5 MG PO TABS: 5.0000 mg | ORAL_TABLET | Freq: Once | ORAL | Status: DC | PRN

## 2021-02-18 MED ORDER — PANTOPRAZOLE SODIUM 40 MG PO TBEC: 40.0000 mg | DELAYED_RELEASE_TABLET | Freq: Two times a day (BID) | ORAL | Status: DC

## 2021-02-18 MED ORDER — PROPOFOL 10 MG/ML IV BOLUS
INTRAVENOUS | Status: AC
  Filled 2021-02-18: qty 40

## 2021-02-18 MED ORDER — CHLORHEXIDINE GLUCONATE CLOTH 2 % EX PADS: 6.0000 | MEDICATED_PAD | Freq: Once | CUTANEOUS | Status: DC

## 2021-02-18 MED ORDER — BUPIVACAINE-EPINEPHRINE (PF) 0.25% -1:200000 IJ SOLN
INTRAMUSCULAR | Status: AC
  Filled 2021-02-18: qty 30

## 2021-02-18 MED ORDER — DEXAMETHASONE SODIUM PHOSPHATE 10 MG/ML IJ SOLN
INTRAMUSCULAR | Status: DC | PRN
  Administered 2021-02-18: 4 mg via INTRAVENOUS

## 2021-02-18 MED ORDER — ONDANSETRON 4 MG PO TBDP: 4.0000 mg | ORAL_TABLET | Freq: Four times a day (QID) | ORAL | Status: DC | PRN

## 2021-02-18 MED ORDER — KETAMINE HCL 10 MG/ML IJ SOLN
INTRAMUSCULAR | Status: AC
  Filled 2021-02-18: qty 1

## 2021-02-18 MED ORDER — ACETAMINOPHEN 650 MG RE SUPP: 650.0000 mg | Freq: Four times a day (QID) | RECTAL | Status: DC | PRN

## 2021-02-18 MED ORDER — ROCURONIUM BROMIDE 10 MG/ML (PF) SYRINGE
PREFILLED_SYRINGE | INTRAVENOUS | Status: AC
  Filled 2021-02-18: qty 10

## 2021-02-18 MED ORDER — LIDOCAINE HCL (CARDIAC) PF 100 MG/5ML IV SOSY
PREFILLED_SYRINGE | INTRAVENOUS | Status: DC | PRN
  Administered 2021-02-18: 100 mg via INTRAVENOUS

## 2021-02-18 MED ORDER — FENTANYL CITRATE (PF) 100 MCG/2ML IJ SOLN: 25.0000 ug | INTRAMUSCULAR | Status: DC | PRN

## 2021-02-18 MED ORDER — LIDOCAINE 2% (20 MG/ML) 5 ML SYRINGE
INTRAMUSCULAR | Status: AC
  Filled 2021-02-18: qty 5

## 2021-02-18 MED ORDER — ONDANSETRON HCL 4 MG/2ML IJ SOLN
INTRAMUSCULAR | Status: DC | PRN
  Administered 2021-02-18: 4 mg via INTRAVENOUS

## 2021-02-18 MED ORDER — OXYCODONE HCL 5 MG/5ML PO SOLN: 5.0000 mg | Freq: Once | ORAL | Status: DC | PRN

## 2021-02-18 MED ORDER — ONDANSETRON HCL 4 MG/2ML IJ SOLN
INTRAMUSCULAR | Status: AC
  Filled 2021-02-18: qty 2

## 2021-02-18 MED ORDER — PROMETHAZINE HCL 25 MG/ML IJ SOLN: 6.2500 mg | INTRAMUSCULAR | Status: DC | PRN

## 2021-02-18 MED ORDER — TRAMADOL HCL 50 MG PO TABS: 50.0000 mg | ORAL_TABLET | Freq: Four times a day (QID) | ORAL | 0 refills | Status: AC | PRN

## 2021-02-18 MED ORDER — SODIUM CHLORIDE 0.45 % IV SOLN: INTRAVENOUS | Status: DC

## 2021-02-18 MED ORDER — FENTANYL CITRATE (PF) 250 MCG/5ML IJ SOLN
INTRAMUSCULAR | Status: AC
  Filled 2021-02-18: qty 5

## 2021-02-18 MED ORDER — TRAMADOL HCL 50 MG PO TABS: 50.0000 mg | ORAL_TABLET | Freq: Four times a day (QID) | ORAL | Status: DC | PRN

## 2021-02-18 MED ORDER — ONDANSETRON HCL 4 MG/2ML IJ SOLN: 4.0000 mg | Freq: Four times a day (QID) | INTRAMUSCULAR | Status: DC | PRN

## 2021-02-18 MED ORDER — BUPIVACAINE-EPINEPHRINE 0.25% -1:200000 IJ SOLN
INTRAMUSCULAR | Status: DC | PRN
  Administered 2021-02-18: 24 mL

## 2021-02-18 MED ORDER — ROCURONIUM BROMIDE 100 MG/10ML IV SOLN
INTRAVENOUS | Status: DC | PRN
  Administered 2021-02-18: 10 mg via INTRAVENOUS
  Administered 2021-02-18: 60 mg via INTRAVENOUS

## 2021-02-18 MED ORDER — LACTATED RINGERS IV SOLN: INTRAVENOUS | Status: DC

## 2021-02-18 MED ORDER — ORAL CARE MOUTH RINSE: 15.0000 mL | Freq: Once | OROMUCOSAL | Status: AC

## 2021-02-18 MED ORDER — MIDAZOLAM HCL 5 MG/5ML IJ SOLN
INTRAMUSCULAR | Status: DC | PRN
  Administered 2021-02-18 (×2): 1 mg via INTRAVENOUS

## 2021-02-18 MED ORDER — FENTANYL CITRATE (PF) 100 MCG/2ML IJ SOLN
INTRAMUSCULAR | Status: AC
  Filled 2021-02-18: qty 2

## 2021-02-18 MED ORDER — CIPROFLOXACIN IN D5W 400 MG/200ML IV SOLN
400.0000 mg | INTRAVENOUS | Status: AC
  Administered 2021-02-18: 400 mg via INTRAVENOUS
  Filled 2021-02-18: qty 200

## 2021-02-18 MED ORDER — KETAMINE HCL 10 MG/ML IJ SOLN
INTRAMUSCULAR | Status: DC | PRN
  Administered 2021-02-18: 50 mg via INTRAVENOUS

## 2021-02-18 MED ORDER — LACTATED RINGERS IR SOLN
Status: DC | PRN
  Administered 2021-02-18: 1000 mL

## 2021-02-18 MED ORDER — OXYCODONE HCL 5 MG PO TABS: 5.0000 mg | ORAL_TABLET | ORAL | Status: DC | PRN

## 2021-02-18 MED ORDER — DEXAMETHASONE SODIUM PHOSPHATE 10 MG/ML IJ SOLN
INTRAMUSCULAR | Status: AC
  Filled 2021-02-18: qty 1

## 2021-02-18 MED ORDER — ACETAMINOPHEN 325 MG PO TABS: 650.0000 mg | ORAL_TABLET | Freq: Four times a day (QID) | ORAL | Status: DC | PRN

## 2021-02-18 MED ORDER — HYDROMORPHONE HCL 1 MG/ML IJ SOLN: 1.0000 mg | INTRAMUSCULAR | Status: DC | PRN

## 2021-02-18 MED ORDER — SUGAMMADEX SODIUM 200 MG/2ML IV SOLN
INTRAVENOUS | Status: DC | PRN
  Administered 2021-02-18: 200 mg via INTRAVENOUS

## 2021-02-18 MED ORDER — MIDAZOLAM HCL 2 MG/2ML IJ SOLN
INTRAMUSCULAR | Status: AC
  Filled 2021-02-18: qty 2

## 2021-02-18 MED ORDER — CHLORHEXIDINE GLUCONATE 0.12 % MT SOLN
15.0000 mL | Freq: Once | OROMUCOSAL | Status: AC
  Administered 2021-02-18: 15 mL via OROMUCOSAL

## 2021-02-18 SURGICAL SUPPLY — 39 items
ADH SKN CLS APL DERMABOND .7 (GAUZE/BANDAGES/DRESSINGS) ×1
APL PRP STRL LF DISP 70% ISPRP (MISCELLANEOUS) ×1
APPLIER CLIP ROT 10 11.4 M/L (STAPLE)
APR CLP MED LRG 11.4X10 (STAPLE)
BAG SPEC RTRVL LRG 6X4 10 (ENDOMECHANICALS) ×1
CHLORAPREP W/TINT 26 (MISCELLANEOUS) ×2 IMPLANT
CLIP APPLIE ROT 10 11.4 M/L (STAPLE) IMPLANT
COVER SURGICAL LIGHT HANDLE (MISCELLANEOUS) ×2 IMPLANT
COVER WAND RF STERILE (DRAPES) IMPLANT
CUTTER FLEX LINEAR 45M (STAPLE) IMPLANT
DECANTER SPIKE VIAL GLASS SM (MISCELLANEOUS) ×2 IMPLANT
DERMABOND ADVANCED (GAUZE/BANDAGES/DRESSINGS) ×1
DERMABOND ADVANCED .7 DNX12 (GAUZE/BANDAGES/DRESSINGS) ×1 IMPLANT
DRAPE LAPAROSCOPIC ABDOMINAL (DRAPES) ×1 IMPLANT
ELECT REM PT RETURN 15FT ADLT (MISCELLANEOUS) ×2 IMPLANT
ENDOLOOP SUT PDS II  0 18 (SUTURE)
ENDOLOOP SUT PDS II 0 18 (SUTURE) IMPLANT
GLOVE SURG ORTHO LTX SZ8 (GLOVE) ×2 IMPLANT
GOWN STRL REUS W/TWL XL LVL3 (GOWN DISPOSABLE) ×4 IMPLANT
KIT BASIN OR (CUSTOM PROCEDURE TRAY) ×2 IMPLANT
KIT TURNOVER KIT A (KITS) ×2 IMPLANT
PENCIL SMOKE EVACUATOR (MISCELLANEOUS) IMPLANT
POUCH SPECIMEN RETRIEVAL 10MM (ENDOMECHANICALS) ×2 IMPLANT
RELOAD 45 VASCULAR/THIN (ENDOMECHANICALS) IMPLANT
RELOAD STAPLE 45 2.5 WHT GRN (ENDOMECHANICALS) IMPLANT
RELOAD STAPLE 45 3.5 BLU ETS (ENDOMECHANICALS) IMPLANT
RELOAD STAPLE TA45 3.5 REG BLU (ENDOMECHANICALS) ×2 IMPLANT
SET IRRIG TUBING LAPAROSCOPIC (IRRIGATION / IRRIGATOR) ×2 IMPLANT
SET TUBE SMOKE EVAC HIGH FLOW (TUBING) ×2 IMPLANT
SHEARS HARMONIC ACE PLUS 36CM (ENDOMECHANICALS) ×2 IMPLANT
STRIP CLOSURE SKIN 1/2X4 (GAUZE/BANDAGES/DRESSINGS) ×2 IMPLANT
SUT MNCRL AB 4-0 PS2 18 (SUTURE) ×2 IMPLANT
TOWEL OR 17X26 10 PK STRL BLUE (TOWEL DISPOSABLE) ×2 IMPLANT
TOWEL OR NON WOVEN STRL DISP B (DISPOSABLE) ×2 IMPLANT
TRAY FOLEY MTR SLVR 14FR STAT (SET/KITS/TRAYS/PACK) IMPLANT
TRAY FOLEY MTR SLVR 16FR STAT (SET/KITS/TRAYS/PACK) ×1 IMPLANT
TRAY LAPAROSCOPIC (CUSTOM PROCEDURE TRAY) ×2 IMPLANT
TROCAR XCEL BLUNT TIP 100MML (ENDOMECHANICALS) ×2 IMPLANT
TROCAR XCEL NON-BLD 11X100MML (ENDOMECHANICALS) ×2 IMPLANT

## 2021-02-18 NOTE — Transfer of Care (Signed)
Immediate Anesthesia Transfer of Care Note  Patient: Chad Morris  Procedure(s) Performed: APPENDECTOMY LAPAROSCOPIC (N/A )  Patient Location: PACU  Anesthesia Type:General  Level of Consciousness: oriented, drowsy and patient cooperative  Airway & Oxygen Therapy: Patient Spontanous Breathing and Patient connected to face mask oxygen  Post-op Assessment: Report given to RN and Post -op Vital signs reviewed and stable  Post vital signs: Reviewed and stable  Last Vitals:  Vitals Value Taken Time  BP 154/88 02/18/21 0915  Temp    Pulse 49 02/18/21 0918  Resp 11 02/18/21 0918  SpO2 100 % 02/18/21 0918  Vitals shown include unvalidated device data.  Last Pain:  Vitals:   02/18/21 0616  TempSrc: Oral  PainSc: 0-No pain         Complications: No complications documented.

## 2021-02-18 NOTE — Discharge Summary (Signed)
Physician Discharge Summary [facility]  Patient ID: Chad Morris MRN: [id number] DOB/AGE: [birth year] 43 y.o.  Admit date: 02/18/2021  Discharge date: 02/18/2021  Discharge Diagnoses:  Principal Problem:   History of appendicitis Active Problems:   Acute appendicitis   Discharged Condition: good  Hospital Course: Patient was admitted for observation following laparoscopic surgery.  Post op course was uncomplicated.  Pain was well controlled.  Tolerated diet. Patient was prepared for discharge home on the afternoon of surgery.  Consults: None  Treatments: surgery: lap appendectomy  Discharge Exam: Blood pressure 129/72, pulse 79, temperature (!) 97.4 F (36.3 C), resp. rate 18, height 6\' 1"  (1.854 m), weight 107 kg, SpO2 100 %. HEENT - clear Neck - soft Chest - clear bilaterally Cor - RRR Abd - wounds dry and intact with Dermabond in place  Disposition: Home  Discharge Instructions    Diet - low sodium heart healthy   Complete by: As directed    Discharge instructions   Complete by: As directed    [facility]  LAPAROSCOPIC SURGERY:  POST-OP INSTRUCTIONS  Always review your discharge instruction sheet given to you by the facility where your surgery was performed.  A prescription for pain medication may be given to you upon discharge.  Take your pain medication as prescribed.  If narcotic pain medicine is not needed, then you may take acetaminophen (Tylenol) or ibuprofen (Advil) as needed.  Take your usually prescribed medications unless otherwise directed.  If you need a refill on your pain medication, please contact your pharmacy.  They will contact our office to request authorization. Prescriptions will not be filled after 5 P.M. or on weekends.  You should follow a light diet the first few days after arrival home, such as soup and crackers or toast.  Be sure to include plenty of fluids daily.  Most patients will  experience some swelling and bruising in the area of the incisions.  Ice packs will help.  Swelling and bruising can take several days to resolve.   It is common to experience some constipation after surgery.  Increasing fluid intake and taking a stool softener (such as Colace) will usually help or prevent this problem from occurring.  A mild laxative (Milk of Magnesia or Miralax) should be taken according to package instructions if there has been no bowel movement after 48 hours.  You will likely have Dermabond (topical glue) over your incisions.  This seals the incisions and allows you to bathe and shower at any time after your surgery.  Glue should remain in place for up to 10 days.  It may be removed after 10 days by pealing off the Dermabond material or using Vaseline or naval jelly to remove.  If you have steri-strips over your incisions, you may remove the gauze bandage on the second day after surgery, and you may shower at that time.  Leave your steri-strips (small skin tapes) in place directly over the incision.  These strips should remain on the skin for 5-7 days and then be removed.  You may get them wet in the shower and pat them dry.  Any sutures or staples will be removed at the office during your follow-up visit.  ACTIVITIES:  You may resume regular (light) daily activities beginning the next day - such as daily self-care, walking, climbing stairs - gradually increasing activities as tolerated.  You may have sexual intercourse when it is comfortable.  Refrain from any heavy lifting  or straining until approved by your doctor.  You may drive when you are no longer taking prescription pain medication, when you can comfortably wear a seatbelt, and when you can safely maneuver your car and apply brakes.  You should see your doctor in the office for a follow-up appointment approximately 2-3 weeks after your surgery.  Make sure that you call for this appointment within a day or two after you  arrive home to insure a convenient appointment time.  WHEN TO CALL YOUR DOCTOR: Fever over 101.0 Inability to urinate Continued bleeding from incision Increased pain, redness, or drainage from the incision Increasing abdominal pain  The clinic staff is available to answer your questions during regular business hours.  Please don't hesitate to call and ask to speak to one of the nurses for clinical concerns.  If you have a medical emergency, go to the nearest emergency room or call 911.  A surgeon from [facility] is always on call for the hospital.  Velora Heckler, MD, [facility] Office: [phone] Toll Free:  [phone] FAX [phone]  Website: [email or web address]   Increase activity slowly   Complete by: As directed    No dressing needed   Complete by: As directed      Allergies as of 02/18/2021      Reactions   Penicillins    Diagnosed as a child, does not recall Sx      Medication List    TAKE these medications   ciprofloxacin 500 MG tablet Commonly known as: CIPRO Take 1 tablet (500 mg total) by mouth 2 (two) times daily.   clotrimazole-betamethasone cream Commonly known as: Lotrisone Apply 1 application topically 2 (two) times daily.   metroNIDAZOLE 500 MG tablet Commonly known as: FLAGYL Take 1 tablet (500 mg total) by mouth every 8 (eight) hours.   pantoprazole 40 MG tablet Commonly known as: PROTONIX Take 1 tablet (40 mg total) by mouth 2 (two) times daily.   traMADol 50 MG tablet Commonly known as: ULTRAM Take 1-2 tablets (50-100 mg total) by mouth every 6 (six) hours as needed for moderate pain.            Discharge Care Instructions  (From admission, onward)         Start     Ordered   02/18/21 0000  No dressing needed        02/18/21 1332          Follow-up Information    [name], MD. Schedule an appointment as soon as possible for a visit in 3 week(s).   Specialty:  General Surgery Why: For wound re-check Contact information: [address] [place] [address] [phone]               [name], MD [facility] Office: [phone]   Signed: [name] 02/18/2021, 1:33 PM

## 2021-02-18 NOTE — Discharge Instructions (Signed)
[facility]  LAPAROSCOPIC SURGERY:  POST-OP INSTRUCTIONS  Always review your discharge instruction sheet given to you by the facility where your surgery was performed.  A prescription for pain medication may be given to you upon discharge.  Take your pain medication as prescribed.  If narcotic pain medicine is not needed, then you may take acetaminophen (Tylenol) or ibuprofen (Advil) as needed.  Take your usually prescribed medications unless otherwise directed.  If you need a refill on your pain medication, please contact your pharmacy.  They will contact our office to request authorization. Prescriptions will not be filled after 5 P.M. or on weekends.  You should follow a light diet the first few days after arrival home, such as soup and crackers or toast.  Be sure to include plenty of fluids daily.  Most patients will experience some swelling and bruising in the area of the incisions.  Ice packs will help.  Swelling and bruising can take several days to resolve.   It is common to experience some constipation after surgery.  Increasing fluid intake and taking a stool softener (such as Colace) will usually help or prevent this problem from occurring.  A mild laxative (Milk of Magnesia or Miralax) should be taken according to package instructions if there has been no bowel movement after 48 hours.  You will likely have Dermabond (topical glue) over your incisions.  This seals the incisions and allows you to bathe and shower at any time after your surgery.  Glue should remain in place for up to 10 days.  It may be removed after 10 days by pealing off the Dermabond material or using Vaseline or naval jelly to remove.  If you have steri-strips over your incisions, you may remove the gauze bandage on the second day after surgery, and you may shower at that time.  Leave your steri-strips (small skin tapes) in place directly over the incision.  These strips should remain on the  skin for 5-7 days and then be removed.  You may get them wet in the shower and pat them dry.  Any sutures or staples will be removed at the office during your follow-up visit.  ACTIVITIES:  You may resume regular (light) daily activities beginning the next day - such as daily self-care, walking, climbing stairs - gradually increasing activities as tolerated.  You may have sexual intercourse when it is comfortable.  Refrain from any heavy lifting or straining until approved by your doctor.  You may drive when you are no longer taking prescription pain medication, when you can comfortably wear a seatbelt, and when you can safely maneuver your car and apply brakes.  You should see your doctor in the office for a follow-up appointment approximately 2-3 weeks after your surgery.  Make sure that you call for this appointment within a day or two after you arrive home to insure a convenient appointment time.  WHEN TO CALL YOUR DOCTOR: 1. Fever over 101.0 2. Inability to urinate 3. Continued bleeding from incision 4. Increased pain, redness, or drainage from the incision 5. Increasing abdominal pain  The clinic staff is available to answer your questions during regular business hours.  Please don't hesitate to call and ask to speak to one of the nurses for clinical concerns.  If you have a medical emergency, go to the nearest emergency room or call 911.  A surgeon from [facility] is always on call for the hospital.  Eris Hannan M. Adra Shepler, MD, FACS [facility] Office: [phone] Toll Free:  [phone] FAX [phone]  Website: [email or web address] 

## 2021-02-18 NOTE — Anesthesia Postprocedure Evaluation (Signed)
Anesthesia Post Note  Patient: Enrrique Mierzwa Irby  Procedure(s) Performed: APPENDECTOMY LAPAROSCOPIC (N/A )     Patient location during evaluation: PACU Anesthesia Type: General Level of consciousness: awake and alert and oriented Pain management: pain level controlled Vital Signs Assessment: post-procedure vital signs reviewed and stable Respiratory status: spontaneous breathing, nonlabored ventilation and respiratory function stable Cardiovascular status: blood pressure returned to baseline Postop Assessment: no apparent nausea or vomiting Anesthetic complications: no   No complications documented.  Last Vitals:  Vitals:   02/18/21 0945 02/18/21 1000  BP: (!) 136/91 133/82  Pulse: (!) 45 (!) 45  Resp: 10 10  Temp:    SpO2: 100% 100%    Last Pain:  Vitals:   02/18/21 1000  TempSrc:   PainSc: 0-No pain                 Kaylyn Layer

## 2021-02-18 NOTE — Anesthesia Procedure Notes (Signed)
Procedure Name: Intubation Date/Time: 02/18/2021 7:38 AM Performed by: Garrel Ridgel, CRNA Pre-anesthesia Checklist: Patient identified, Emergency Drugs available, Suction available and Patient being monitored Patient Re-evaluated:Patient Re-evaluated prior to induction Oxygen Delivery Method: Circle system utilized Preoxygenation: Pre-oxygenation with 100% oxygen Induction Type: IV induction Ventilation: Mask ventilation without difficulty Laryngoscope Size: Mac and 4 Grade View: Grade I Tube type: Oral Tube size: 7.5 mm Number of attempts: 1 Airway Equipment and Method: Stylet and Oral airway Placement Confirmation: ETT inserted through vocal cords under direct vision,  positive ETCO2 and breath sounds checked- equal and bilateral Tube secured with: Tape Dental Injury: Teeth and Oropharynx as per pre-operative assessment

## 2021-02-18 NOTE — Op Note (Addendum)
OPERATIVE REPORT - LAPAROSCOPIC APPENDECTOMY  Preop diagnosis:  Appendicitis   Postop diagnosis:  same  Procedure:  Interval laparoscopic appendectomy  Surgeon:  [name], MD  Resident Surgeon: Raina Mina, MD   Anesthesia:  general endotracheal  Estimated blood loss:  minimal  Preparation:  Chlora-prep  Complications:  none  Indications:  45 y.o. man with recent appendicitis treated medically with antibiotics in January in the setting of COVID infection. He recovered well and is interested in proceeding with surgical resection. He presents today for interval appendectomy.    Procedure:  The patient is brought to the operating room and placed in a supine position on the operating room table. Following administration of general anesthesia, a time out was held and the patient's name and procedure is confirmed. The patient is then prepped and draped in the usual strict aseptic fashion.  After ascertaining that an adequate level of anesthesia has been achieved, a vertical supra-umbilical incision is made with a #15 blade. Dissection is carried down to the fascia. The fascia is incised in the midline and the peritoneal cavity is entered under direct vision. A #0-vicryl pursestring suture is placed in the fascia. An Hassan cannula is introduced under direct vision and secured with the pursestring suture. The abdomen is insufflated with carbon dioxide. The laparoscope is introduced and the abdomen is explored. Additional operative ports are placed, with a 5 mm port in the right upper quadrant and a 12 mm port in the left lower quadrant.   The cecum and small bowel were deflected medially and attachments between the cecum, terminal ileum and pelvic side wall were taken laterally. The appendix was identified retrocecally along the paracolic gutter and did not appear acutely inflamed. Dissection is carried down to the base of the appendix. A window was created bluntly between the base of the  appendix and the junction with the cecal wall. Using an Endo-GIA stapler, the base of the appendix is transected at the junction with the cecal wall. There is good approximation of tissue along the staple line and there is good hemostasis along the staple line. The mesoappendix is then divided with the harmonic scalpel. The appendix is placed into an endo-catch bag and withdrawn through the umbilical port. The staple line and right lower quadrant were irrigated and suctioned dry. There was no gross spillage and the area was hemostatic. The ports are removed under direct vision. Good hemostasis is noted at the port sites. Pneumoperitoneum is released. The #0-vicryl pursestring suture is tied securely.  Skin incisions are anesthetized with local anesthetic. Wounds are closed with interrupted 4-0 Monocryl subcuticular sutures. Wounds are washed and dried and Dermabond was applied. The patient is awakened from anesthesia and brought to the recovery room. The patient tolerated the procedure well. All sponge, needle and instrument counts were correct at the end of the procedure. Dr. Gerrit Friends was present for the entire case.   Raina Mina, MD   I was present for the critical and key portions of the surgery and I was immediately available throughout the entire procedure.  I have reviewed and agree with the operative note as documented by the resident.   [name], MD [facility] Office: [phone]

## 2021-02-18 NOTE — Interval H&P Note (Signed)
History and Physical Interval Note:  02/18/2021 7:13 AM  Chad Morris  has presented today for surgery, with the diagnosis of HISTORY OF ACTUE APPENDECITIS.  The various methods of treatment have been discussed with the patient and family. After consideration of risks, benefits and other options for treatment, the patient has consented to    Procedure(s) with comments: APPENDECTOMY LAPAROSCOPIC (N/A) - ROOM 5 STARTING AT 07:30 AM FOR 90 MIN as a surgical intervention.    The patient's history has been reviewed, patient examined, no change in status, stable for surgery.  I have reviewed the patient's chart and labs.  Questions were answered to the patient's satisfaction.    [name], MD [facility] Office: [phone]   [name]

## 2021-02-18 NOTE — Progress Notes (Signed)
Discharge instructions given to patient and all questions were answered.  

## 2021-02-19 ENCOUNTER — Encounter (HOSPITAL_COMMUNITY): Payer: Self-pay | Admitting: Surgery

## 2021-02-19 LAB — SURGICAL PATHOLOGY

## 2021-02-27 ENCOUNTER — Encounter: Payer: BC Managed Care – PPO | Admitting: Family Medicine
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
## 2021-08-20 ENCOUNTER — Other Ambulatory Visit: Payer: Self-pay | Admitting: Family Medicine

## 2021-10-28 ENCOUNTER — Telehealth: Payer: Self-pay | Admitting: Family Medicine

## 2021-10-28 DIAGNOSIS — L301 Dyshidrosis [pompholyx]: Secondary | ICD-10-CM

## 2021-10-28 MED ORDER — CLOTRIMAZOLE-BETAMETHASONE 1-0.05 % EX CREA: 1.0000 "application " | TOPICAL_CREAM | Freq: Two times a day (BID) | CUTANEOUS | 0 refills | Status: AC

## 2021-10-28 NOTE — Addendum Note (Signed)
Addended by: Loyola Mast on: 10/28/2021 05:37 PM   Modules accepted: Orders

## 2021-10-28 NOTE — Telephone Encounter (Signed)
Refill request for: Lotrisone Cream LR 01/30/21, o rf LOV 01/30/21 FOV  none scheduled.    Please review and advise.  Thanks.  Dm/cma

## 2021-10-29 NOTE — Telephone Encounter (Signed)
Spoke to patient's wife, and advised that RX was sent to pharmacy.  Appointment scheduled on 11/12/21 @ 4:00 pm. Dm/cma

## 2021-11-05 ENCOUNTER — Ambulatory Visit: Payer: BC Managed Care – PPO | Admitting: Podiatry

## 2021-11-10 ENCOUNTER — Other Ambulatory Visit: Payer: Self-pay

## 2021-11-10 ENCOUNTER — Encounter: Payer: Self-pay | Admitting: Podiatry

## 2021-11-10 ENCOUNTER — Ambulatory Visit (INDEPENDENT_AMBULATORY_CARE_PROVIDER_SITE_OTHER): Payer: BC Managed Care – PPO

## 2021-11-10 ENCOUNTER — Ambulatory Visit: Payer: BC Managed Care – PPO | Admitting: Podiatry

## 2021-11-10 DIAGNOSIS — B070 Plantar wart: Secondary | ICD-10-CM

## 2021-11-10 DIAGNOSIS — M2042 Other hammer toe(s) (acquired), left foot: Secondary | ICD-10-CM

## 2021-11-11 NOTE — Progress Notes (Signed)
Subjective:   Patient ID: Chad Morris, male   DOB: 45 y.o.   MRN: [id number]   HPI Patient presents stating he is doing very well with the surgery between his toes but he is getting some discomfort underneath his foot neuro   ROS      Objective:  Physical Exam  Vascular status intact with patient found to have lesions underneath the left foot that is irritated and there is small lesions within it     Assessment:  I do think that this is related to the skin structure the patient has with possible verruca plantaris present     Plan:  Debridement of tissue I applied chemical agent with possibility for verruca under sterile conditions and patient will be seen back with excellent recovery from having had syndactylization arthroplasty fourth interspace left new  X-rays indicate that it is healing well satisfactory resection of bone head of proximal phalanx fifth digit

## 2021-11-12 ENCOUNTER — Ambulatory Visit: Payer: BC Managed Care – PPO | Admitting: Family Medicine

## 2021-12-10 ENCOUNTER — Encounter: Payer: BC Managed Care – PPO | Admitting: Family Medicine

## 2022-01-05 ENCOUNTER — Other Ambulatory Visit: Payer: Self-pay

## 2022-01-06 ENCOUNTER — Encounter: Payer: Self-pay | Admitting: Family Medicine

## 2022-01-06 ENCOUNTER — Ambulatory Visit (INDEPENDENT_AMBULATORY_CARE_PROVIDER_SITE_OTHER): Payer: BC Managed Care – PPO | Admitting: Family Medicine

## 2022-01-06 VITALS — BP 124/80 | HR 67 | Temp 97.6°F | Ht 73.0 in | Wt 252.4 lb

## 2022-01-06 DIAGNOSIS — Z113 Encounter for screening for infections with a predominantly sexual mode of transmission: Secondary | ICD-10-CM

## 2022-01-06 DIAGNOSIS — Z1159 Encounter for screening for other viral diseases: Secondary | ICD-10-CM | POA: Diagnosis not present

## 2022-01-06 DIAGNOSIS — Z7184 Encounter for health counseling related to travel: Secondary | ICD-10-CM | POA: Diagnosis not present

## 2022-01-06 DIAGNOSIS — R1031 Right lower quadrant pain: Secondary | ICD-10-CM | POA: Diagnosis not present

## 2022-01-06 DIAGNOSIS — R7303 Prediabetes: Secondary | ICD-10-CM

## 2022-01-06 NOTE — Progress Notes (Signed)
[facility] [address] [place] [address] Dept: [phone] Dept Fax: [phone]  Office Visit  Subjective:    Patient ID: Chad Morris, male    DOB: Jan 06, 1976, 46 y.o..   MRN: [id number]  Chief Complaint  Patient presents with   Abdominal Pain    RLQ abdominal pain for 4-5 months    History of Present Illness:  Patient is in today complaining of a 3-4 month history of RLQ pain. The appointment indicated he wanted an annual physical, but he prefers to have this pain issue evaluated. He notes the pain developed gradually about 4 months ago. He describes this as sharp at times, but also a pulling sensation with the pain radiating from the RLQ tot he right lower back. He denies any rash. He does not recall doing lifting at the time the pain developed. He has some alternating diarrhea and constipation. His diarrhea tends to be soft bowel movements, rather than watery diarrhea. He denies any hematochezia or melena. He denies dysuria or hematuria, though he does urinate frequently, noting he got up 3 times last night. He also admits to some increased thirst. He has not had any nausea or vomiting. Mr. Schmidtke had an appendectomy last March. He smokes marijuana regularly and smells heavily of marijuana once again today.  Mr. Futrell also requests an STD check. He notes he typically does use condoms. He had a condom break a few weeks ago, but denies any current symptoms of an STD.  Mr. Redler notes that he will be traveling to [place] to visit family. He has questions regarding yellow fever vaccination.  Past Medical History: Patient Active Problem List   Diagnosis Date Noted   History of appendicitis 02/16/2021   Marijuana use, continuous 01/28/2021   Dyshidrotic hand dermatitis, Left 01/28/2021   Gastroesophageal reflux disease with esophagitis without hemorrhage 08/27/2020   Acute esophagitis 08/27/2020   Prediabetes  01/31/2020   Past Surgical History:  Procedure Laterality Date   FOOT SURGERY Right    right 5th digit   LAPAROSCOPIC APPENDECTOMY N/A 02/18/2021   Procedure: APPENDECTOMY LAPAROSCOPIC;  Surgeon: Armandina Gemma, MD;  Location: [facility];  Service: General;  Laterality: N/A;  ROOM 5 STARTING AT 07:30 AM FOR 65 MIN   TOOTH EXTRACTION     UPPER GASTROINTESTINAL ENDOSCOPY     Family History  Problem Relation Age of Onset   Leukemia Mother    Stroke Father    Breast cancer Sister        51   Colon cancer Neg Hx    Esophageal cancer Neg Hx    Rectal cancer Neg Hx    Stomach cancer Neg Hx    Outpatient Medications Prior to Visit  Medication Sig Dispense Refill   clotrimazole-betamethasone (LOTRISONE) cream Apply 1 application topically 2 (two) times daily. 30 g 0   Multiple Vitamin (MULTIVITAMIN) tablet Take 1 tablet by mouth daily.     pantoprazole (PROTONIX) 40 MG tablet Take 1 tablet (40 mg total) by mouth 2 (two) times daily. 60 tablet 3   traMADol (ULTRAM) 50 MG tablet Take 1-2 tablets (50-100 mg total) by mouth every 6 (six) hours as needed for moderate pain. 15 tablet 0   No facility-administered medications prior to visit.   Allergies  Allergen Reactions   Penicillins     Diagnosed as a child, does not recall Sx     Objective:   Today's Vitals   01/06/22 1459  BP: 124/80  Pulse: 67  Temp: 97.6 F (36.4 C)  TempSrc: Temporal  SpO2: 98%  Weight: 252 lb 6.4 oz (114.5 kg)  Height: 6\' 1"  (1.854 m)   Body mass index is 33.3 kg/m.   General: Well developed, well nourished. No acute distress. HEENT: Normocephalic, non-traumatic. Conjunctiva clear. External ears normal. EAC and TMs normal bilaterally. Nose    clear without congestion or rhinorrhea. Mucous membranes moist. Oropharynx clear. Good dentition. Neck: Supple. No lymphadenopathy. No thyromegaly. Lungs: Clear to auscultation bilaterally. No wheezing, rales or rhonchi. CV: RRR without murmurs or rubs. Pulses 2+  bilaterally. Abdomen: Soft, non-tender. Bowel sounds positive, normal pitch and frequency. No hepatosplenomegaly. No rebound or   guarding. Back: Straight. No CVA tenderness bilaterally. Psych: Alert and oriented. Normal mood and affect.  Health Maintenance Due  Topic Date Due   COVID-19 Vaccine (1) Never done   Hepatitis C Screening  Never done   INFLUENZA VACCINE  Never done     Assessment & Plan:   1. Right lower quadrant abdominal pain The etiology of the RLQ pain is uncertain. I will check a urine and blood work to assess this. I will also order an abdominal CT to look for various etiologies. I will follow-up with him after this work-up is complete.  - Comprehensive metabolic panel - CBC with Differential/Platelet - Urinalysis w microscopic + reflex cultur - CT Abdomen Pelvis Wo Contrast; Future  2. Screening for STD (sexually transmitted disease) Recommend ongoing condom use.  - Chlamydia/GC NAA, Confirmation - HIV Antibody (routine testing w rflx) - RPR  3. Travel advice encounter I provided Mr. Mathe with information form the CDC website through My Chart regarding travel to [place]. I recommended he be seen at the health department, which offers travel services. If he runs into trouble accomplishing this, he can follow-up with me.  4. Encounter for hepatitis C screening test for low risk patient  - HCV Ab w Reflex to Quant PCR  Haydee Salter, MD

## 2022-01-07 ENCOUNTER — Encounter: Payer: Self-pay | Admitting: Family Medicine

## 2022-01-07 LAB — URINALYSIS W MICROSCOPIC + REFLEX CULTURE
Bacteria, UA: NONE SEEN /HPF
Bilirubin Urine: NEGATIVE
Glucose, UA: NEGATIVE
Hgb urine dipstick: NEGATIVE
Hyaline Cast: NONE SEEN /LPF
Leukocyte Esterase: NEGATIVE
Nitrites, Initial: NEGATIVE
Protein, ur: NEGATIVE
RBC / HPF: NONE SEEN /HPF (ref 0–2)
Specific Gravity, Urine: 1.02 (ref 1.001–1.035)
Squamous Epithelial / HPF: NONE SEEN /HPF (ref <=5)
WBC, UA: NONE SEEN /HPF (ref 0–5)
pH: 7 (ref 5.0–8.0)

## 2022-01-07 LAB — RPR: RPR Ser Ql: NONREACTIVE

## 2022-01-07 LAB — COMPREHENSIVE METABOLIC PANEL
ALT: 19 U/L (ref 0–53)
AST: 17 U/L (ref 0–37)
Albumin: 4.3 g/dL (ref 3.5–5.2)
Alkaline Phosphatase: 66 U/L (ref 39–117)
BUN: 8 mg/dL (ref 6–23)
CO2: 30 mEq/L (ref 19–32)
Calcium: 9.6 mg/dL (ref 8.4–10.5)
Chloride: 106 mEq/L (ref 96–112)
Creatinine, Ser: 1.19 mg/dL (ref 0.40–1.50)
GFR: 75.01 mL/min (ref >60.00)
Glucose, Bld: 85 mg/dL (ref 70–99)
Potassium: 4.2 mEq/L (ref 3.5–5.1)
Sodium: 140 mEq/L (ref 135–145)
Total Bilirubin: 1.7 mg/dL — ABNORMAL HIGH (ref 0.2–1.2)
Total Protein: 7.4 g/dL (ref 6.0–8.3)

## 2022-01-07 LAB — CBC WITH DIFFERENTIAL/PLATELET
Basophils Absolute: 0 10*3/uL (ref 0.0–0.1)
Basophils Relative: 0.7 % (ref 0.0–3.0)
Eosinophils Absolute: 0.1 10*3/uL (ref 0.0–0.7)
Eosinophils Relative: 3.3 % (ref 0.0–5.0)
HCT: 43.3 % (ref 39.0–52.0)
Hemoglobin: 14.3 g/dL (ref 13.0–17.0)
Lymphocytes Relative: 42.8 % (ref 12.0–46.0)
Lymphs Abs: 1.8 10*3/uL (ref 0.7–4.0)
MCHC: 33 g/dL (ref 30.0–36.0)
MCV: 91.2 fl (ref 78.0–100.0)
Monocytes Absolute: 0.5 10*3/uL (ref 0.1–1.0)
Monocytes Relative: 12 % (ref 3.0–12.0)
Neutro Abs: 1.7 10*3/uL (ref 1.4–7.7)
Neutrophils Relative %: 41.2 % — ABNORMAL LOW (ref 43.0–77.0)
Platelets: 208 10*3/uL (ref 150.0–400.0)
RBC: 4.75 Mil/uL (ref 4.22–5.81)
RDW: 14.3 % (ref 11.5–15.5)
WBC: 4.1 10*3/uL (ref 4.0–10.5)

## 2022-01-07 LAB — HCV INTERPRETATION

## 2022-01-07 LAB — NO CULTURE INDICATED

## 2022-01-07 LAB — HCV AB W REFLEX TO QUANT PCR: HCV Ab: <0.1 s/co ratio (ref 0.0–0.9)

## 2022-01-07 LAB — HIV ANTIBODY (ROUTINE TESTING W REFLEX): HIV 1&2 Ab, 4th Generation: NONREACTIVE

## 2022-01-09 LAB — CHLAMYDIA/GC NAA, CONFIRMATION
Chlamydia trachomatis, NAA: NEGATIVE
Neisseria gonorrhoeae, NAA: NEGATIVE

## 2022-05-13 ENCOUNTER — Ambulatory Visit (INDEPENDENT_AMBULATORY_CARE_PROVIDER_SITE_OTHER): Payer: BC Managed Care – PPO

## 2022-05-13 ENCOUNTER — Ambulatory Visit (INDEPENDENT_AMBULATORY_CARE_PROVIDER_SITE_OTHER): Payer: BC Managed Care – PPO | Admitting: Podiatry

## 2022-05-13 ENCOUNTER — Encounter: Payer: Self-pay | Admitting: Podiatry

## 2022-05-13 DIAGNOSIS — B070 Plantar wart: Secondary | ICD-10-CM | POA: Diagnosis not present

## 2022-05-13 DIAGNOSIS — M779 Enthesopathy, unspecified: Secondary | ICD-10-CM | POA: Diagnosis not present

## 2022-05-13 NOTE — Progress Notes (Signed)
Subjective:   Patient ID: Chad Morris, male   DOB: 46 y.o.   MRN: [id number]   HPI Patient presents stating this lesion on the bottom of the left as well for about 5 months then it seems to recur and is very satisfied with how we treated   ROS      Objective:  Physical Exam  Neurovascular status intact keratotic lesions of the left lateral arch that upon debridement shows slight pinpoint bleeding pain to lateral pressure     Assessment:  Probability for verruca plantaris plantar left     Plan:  Sharp sterile debridement of the lesion and then applied chemical agent to drain immune response with sterile dressing instructed what to do if any blistering were to occur reappoint as needed

## 2022-06-18 ENCOUNTER — Ambulatory Visit
Admission: RE | Admit: 2022-06-18 | Discharge: 2022-06-18 | Disposition: A | Discharge status: Home / Self Care | Payer: BC Managed Care – PPO | Source: Ambulatory Visit | Attending: Family Medicine | Admitting: Family Medicine

## 2022-06-18 DIAGNOSIS — R1031 Right lower quadrant pain: Secondary | ICD-10-CM

## 2022-10-16 ENCOUNTER — Telehealth: Payer: Self-pay | Admitting: Family Medicine

## 2022-10-16 ENCOUNTER — Ambulatory Visit: Payer: BC Managed Care – PPO | Admitting: Family Medicine

## 2022-10-16 NOTE — Telephone Encounter (Signed)
Pt was a no show 11/10 for an OV with Dr. Veto Kemps. This is his second (same day cancel 12/07).  Letter sent

## 2022-10-20 NOTE — Telephone Encounter (Signed)
1st no show + 1 prior same day cancel, fee generated, letter sent
